# Patient Record
Sex: Male | Born: 1980 | Race: White | Hispanic: No | Marital: Single | Smoking: Never smoker
Health system: Southern US, Community
[De-identification: ages and names within clinical notes are randomized; demographics above are authoritative.]

## PROBLEM LIST (undated history)

## (undated) DIAGNOSIS — R569 Unspecified convulsions: Secondary | ICD-10-CM

## (undated) HISTORY — PX: NASAL SINUS SURGERY: SHX719

---

## 2014-06-28 ENCOUNTER — Ambulatory Visit (INDEPENDENT_AMBULATORY_CARE_PROVIDER_SITE_OTHER): Payer: BC Managed Care – PPO | Admitting: Sports Medicine

## 2014-06-28 DIAGNOSIS — M7650 Patellar tendinitis, unspecified knee: Secondary | ICD-10-CM

## 2014-06-28 DIAGNOSIS — M7651 Patellar tendinitis, right knee: Secondary | ICD-10-CM | POA: Insufficient documentation

## 2014-06-28 MED ORDER — NITROGLYCERIN 0.2 MG/HR TD PT24: MEDICATED_PATCH | TRANSDERMAL | Status: DC

## 2014-06-28 MED ORDER — DICLOFENAC SODIUM 75 MG PO TBEC: 75.0000 mg | DELAYED_RELEASE_TABLET | Freq: Two times a day (BID) | ORAL | Status: DC

## 2014-06-28 NOTE — Progress Notes (Signed)
  Subjective:    CC: Right knee pain  HPI:  This is a very pleasant 33 year old male, for the past 5 months he said and he localizes at the inferior pole of the right patella over the patellar tendon. Is moderate, persistent, worse with deep knee bending, squats, and lunches. He has not tried any NSAIDs, he did some physical therapy where they worked only on stretching but not strengthening.  Past medical history, Surgical history, Family history not pertinant except as noted below, Social history, Allergies, and medications have been entered into the medical record, reviewed, and no changes needed.   Review of Systems: No headache, visual changes, nausea, vomiting, diarrhea, constipation, dizziness, abdominal pain, skin rash, fevers, chills, night sweats, swollen lymph nodes, weight loss, chest pain, body aches, joint swelling, muscle aches, shortness of breath, mood changes, visual or auditory hallucinations.  Objective:    General: Well Developed, well nourished, and in no acute distress.  Neuro: Alert and oriented x3, extra-ocular muscles intact, sensation grossly intact.  HEENT: Normocephalic, atraumatic, pupils equal round reactive to light, neck supple, no masses, no lymphadenopathy, thyroid nonpalpable.  Skin: Warm and dry, no rashes noted.  Cardiac: Regular rate and rhythm, no murmurs rubs or gallops.  Respiratory: Clear to auscultation bilaterally. Not using accessory muscles, speaking in full sentences.  Abdominal: Soft, nontender, nondistended, positive bowel sounds, no masses, no organomegaly.  Right Knee: Normal to inspection with no erythema or effusion or obvious bony abnormalities. Palpation normal with no warmth or joint line tenderness or patellar tenderness or condyle tenderness. No tenderness of the patellar facets. ROM normal in flexion and extension and lower leg rotation. Ligaments with solid consistent endpoints including ACL, PCL, LCL, MCL. Negative Mcmurray's and  provocative meniscal tests. Non painful patellar compression. Tender to palpation at the proximal patellar tendon at its insertion on the patella. Hamstring and quadriceps strength is normal.  Cho-Pat strap placed  Impression and Recommendations:    The patient was counselled, risk factors were discussed, anticipatory guidance given.

## 2014-06-28 NOTE — Assessment & Plan Note (Signed)
Nitroglycerin patches, Cho-Pat strep, formal physical therapy, diclofenac. Return in 6 weeks.

## 2014-07-01 ENCOUNTER — Encounter: Payer: Self-pay | Admitting: Emergency Medicine

## 2014-07-01 ENCOUNTER — Emergency Department
Admission: EM | Admit: 2014-07-01 | Discharge: 2014-07-01 | Disposition: A | Discharge status: Home / Self Care | Payer: BC Managed Care – PPO | Source: Home / Self Care | Attending: Emergency Medicine | Admitting: Emergency Medicine

## 2014-07-01 DIAGNOSIS — L237 Allergic contact dermatitis due to plants, except food: Secondary | ICD-10-CM

## 2014-07-01 DIAGNOSIS — L255 Unspecified contact dermatitis due to plants, except food: Secondary | ICD-10-CM

## 2014-07-01 HISTORY — DX: Unspecified convulsions: R56.9

## 2014-07-01 MED ORDER — CLOBETASOL PROPIONATE 0.05 % EX CREA: 1.0000 "application " | TOPICAL_CREAM | Freq: Two times a day (BID) | CUTANEOUS | Status: DC

## 2014-07-01 MED ORDER — PREDNISONE 20 MG PO TABS: 20.0000 mg | ORAL_TABLET | Freq: Two times a day (BID) | ORAL | Status: DC

## 2014-07-01 NOTE — ED Notes (Signed)
Pt c/o rash on his RT arm and hand x 8 days. Denies pain. Hydrocortisone cream is not helping.

## 2014-07-01 NOTE — ED Provider Notes (Signed)
CSN: [id number]     Arrival date & time 07/01/14  1200 History   First MD Initiated Contact with Patient 07/01/14 1225     Chief Complaint  Patient presents with  . Rash   (Consider location/radiation/quality/duration/timing/severity/associated sxs/prior Treatment) HPI This patient complains of a RASH.  He was cutting down a tree in his back yard.  He knows that there is poison ivy back there but wasn't aware if he touched it.  Location: hand, arm, leg  Onset: last few days   Course: worsening Self-treated with: cortisone             Improvement with treatment: mild  History Itching: yes  Tenderness: no  New medications/antibiotics: no  Pet exposure: no  Recent travel or tropical exposure: no  New soaps, shampoos, detergent, clothing: no  Tick/insect exposure: no   Red Flags Feeling ill: no  Fever: no  Facial/tongue swelling/difficulty breathing:  no  Diabetic or immunocompromised: no      Past Medical History  Diagnosis Date  . Seizures    Past Surgical History  Procedure Laterality Date  . Nasal sinus surgery     History reviewed. No pertinent family history. History  Substance Use Topics  . Smoking status: Never Smoker   . Smokeless tobacco: Not on file  . Alcohol Use: No    Review of Systems  All other systems reviewed and are negative.   Allergies  Bee venom  Home Medications   Prior to Admission medications   Medication Sig Start Date End Date Taking? Authorizing Provider  clobetasol cream (TEMOVATE) 0.05 % Apply 1 application topically 2 (two) times daily. Apply to affected areas 2-3 times per day 07/01/14   Marlaine Hind, MD  diclofenac (VOLTAREN) 75 MG EC tablet Take 1 tablet (75 mg total) by mouth 2 (two) times daily. 06/28/14   Monica Becton, MD  nitroGLYCERIN (NITRODUR - DOSED IN MG/24 HR) 0.2 mg/hr patch Cut and apply 1/4 patch to most painful area q24h. 06/28/14   Monica Becton, MD  predniSONE (DELTASONE) 20 MG tablet Take  1 tablet (20 mg total) by mouth 2 (two) times daily with a meal. 07/01/14   Marlaine Hind, MD   BP 120/76  Pulse 69  Temp(Src) 98.4 F (36.9 C) (Oral)  Resp 16  Ht  (1.88 m)  Wt 185 lb (83.915 kg)  BMI 23.74 kg/m2  SpO2 99% Physical Exam  Nursing note and vitals reviewed. Constitutional: He is oriented to person, place, and time. He appears well-developed and well-nourished.  HENT:  Head: Normocephalic and atraumatic.  Eyes: No scleral icterus.  Neck: Neck supple.  Cardiovascular: Regular rhythm and normal heart sounds.   Pulmonary/Chest: Effort normal and breath sounds normal. No respiratory distress.  Neurological: He is alert and oriented to person, place, and time.  Skin: Skin is warm and dry. Rash noted.     Rash as drawn.  Scattered linear and erythematous excorations c/w contact dermatitis.  Psychiatric: He has a normal mood and affect. His speech is normal.    ED Course  Procedures (including critical care time) Labs Review Labs Reviewed - No data to display  Imaging Review No results found.   MDM   1. Poison ivy dermatitis    Patient with poison ivy dermatitis.  Will Rx Clobetasol cream.  If not improved, start the prednisone Rx.  Avoid scratching and further irritation.  Follow up as needed.    Marlaine Hind, MD 07/01/14 1230

## 2014-07-15 ENCOUNTER — Ambulatory Visit (INDEPENDENT_AMBULATORY_CARE_PROVIDER_SITE_OTHER): Payer: BC Managed Care – PPO | Admitting: Physical Therapy

## 2014-07-15 DIAGNOSIS — R5381 Other malaise: Secondary | ICD-10-CM

## 2014-07-15 DIAGNOSIS — M2560 Stiffness of unspecified joint, not elsewhere classified: Secondary | ICD-10-CM

## 2014-07-15 DIAGNOSIS — M25569 Pain in unspecified knee: Secondary | ICD-10-CM

## 2014-07-15 DIAGNOSIS — M7650 Patellar tendinitis, unspecified knee: Secondary | ICD-10-CM

## 2014-07-18 ENCOUNTER — Encounter: Payer: BC Managed Care – PPO | Admitting: Physical Therapy

## 2014-07-21 ENCOUNTER — Encounter (INDEPENDENT_AMBULATORY_CARE_PROVIDER_SITE_OTHER): Payer: BC Managed Care – PPO | Admitting: Physical Therapy

## 2014-07-21 DIAGNOSIS — M7650 Patellar tendinitis, unspecified knee: Secondary | ICD-10-CM

## 2014-07-21 DIAGNOSIS — M25569 Pain in unspecified knee: Secondary | ICD-10-CM

## 2014-07-21 DIAGNOSIS — M2560 Stiffness of unspecified joint, not elsewhere classified: Secondary | ICD-10-CM

## 2014-07-21 DIAGNOSIS — R5381 Other malaise: Secondary | ICD-10-CM

## 2014-07-25 ENCOUNTER — Encounter: Payer: BC Managed Care – PPO | Admitting: Physical Therapy

## 2014-07-28 ENCOUNTER — Encounter (INDEPENDENT_AMBULATORY_CARE_PROVIDER_SITE_OTHER): Payer: BC Managed Care – PPO

## 2014-07-28 DIAGNOSIS — R5381 Other malaise: Secondary | ICD-10-CM

## 2014-07-28 DIAGNOSIS — M2560 Stiffness of unspecified joint, not elsewhere classified: Secondary | ICD-10-CM

## 2014-07-28 DIAGNOSIS — M25569 Pain in unspecified knee: Secondary | ICD-10-CM

## 2014-07-28 DIAGNOSIS — M7650 Patellar tendinitis, unspecified knee: Secondary | ICD-10-CM

## 2014-08-01 ENCOUNTER — Encounter (INDEPENDENT_AMBULATORY_CARE_PROVIDER_SITE_OTHER): Payer: BC Managed Care – PPO | Admitting: Physical Therapy

## 2014-08-01 DIAGNOSIS — M2560 Stiffness of unspecified joint, not elsewhere classified: Secondary | ICD-10-CM

## 2014-08-01 DIAGNOSIS — M25569 Pain in unspecified knee: Secondary | ICD-10-CM

## 2014-08-01 DIAGNOSIS — R5381 Other malaise: Secondary | ICD-10-CM

## 2014-08-09 ENCOUNTER — Ambulatory Visit: Payer: BC Managed Care – PPO | Admitting: Sports Medicine

## 2014-08-09 ENCOUNTER — Encounter: Payer: BC Managed Care – PPO | Admitting: Physical Therapy

## 2014-08-11 ENCOUNTER — Encounter (INDEPENDENT_AMBULATORY_CARE_PROVIDER_SITE_OTHER): Payer: Self-pay

## 2014-08-11 ENCOUNTER — Encounter (INDEPENDENT_AMBULATORY_CARE_PROVIDER_SITE_OTHER): Payer: BC Managed Care – PPO | Admitting: Physical Therapy

## 2014-08-11 DIAGNOSIS — M25569 Pain in unspecified knee: Secondary | ICD-10-CM

## 2014-08-11 DIAGNOSIS — M7650 Patellar tendinitis, unspecified knee: Secondary | ICD-10-CM

## 2014-08-11 DIAGNOSIS — M2560 Stiffness of unspecified joint, not elsewhere classified: Secondary | ICD-10-CM

## 2014-08-11 DIAGNOSIS — R5381 Other malaise: Secondary | ICD-10-CM

## 2014-08-12 ENCOUNTER — Ambulatory Visit (INDEPENDENT_AMBULATORY_CARE_PROVIDER_SITE_OTHER): Payer: BC Managed Care – PPO | Admitting: Sports Medicine

## 2014-08-12 ENCOUNTER — Telehealth: Payer: Self-pay | Admitting: *Deleted

## 2014-08-12 ENCOUNTER — Encounter: Payer: Self-pay | Admitting: Sports Medicine

## 2014-08-12 VITALS — BP 127/71 | HR 72 | Ht 74.0 in | Wt 184.0 lb

## 2014-08-12 DIAGNOSIS — M7651 Patellar tendinitis, right knee: Secondary | ICD-10-CM

## 2014-08-12 NOTE — Progress Notes (Signed)
  Subjective:    CC: Followup  HPI: Right patellar tendinitis: Fred Elliott is a pleasant and healthy 33 year old male, for approximately 6 months now he's had pain that he localizes at the inferior pole of the patella, worse with bending the knee and squatting. Symptoms have been diagnosed as patellar tendinitis and we have now gone through 6 weeks of formal physical therapy, patellar strapping, topical nitroglycerin, and NSAIDs. Unfortunately symptoms are persistent, localized, without radiation, moderate.  Past medical history, Surgical history, Family history not pertinant except as noted below, Social history, Allergies, and medications have been entered into the medical record, reviewed, and no changes needed.   Review of Systems: No fevers, chills, night sweats, weight loss, chest pain, or shortness of breath.   Objective:    General: Well Developed, well nourished, and in no acute distress.  Neuro: Alert and oriented x3, extra-ocular muscles intact, sensation grossly intact.  HEENT: Normocephalic, atraumatic, pupils equal round reactive to light, neck supple, no masses, no lymphadenopathy, thyroid nonpalpable.  Skin: Warm and dry, no rashes. Cardiac: Regular rate and rhythm, no murmurs rubs or gallops, no lower extremity edema.  Respiratory: Clear to auscultation bilaterally. Not using accessory muscles, speaking in full sentences. Right Knee: Normal to inspection with no erythema or effusion or obvious bony abnormalities. Palpation normal with no warmth or joint line tenderness or patellar tenderness or condyle tenderness. ROM normal in flexion and extension and lower leg rotation. Ligaments with solid consistent endpoints including ACL, PCL, LCL, MCL. Negative Mcmurray's and provocative meniscal tests. Non painful patellar compression. Tender to palpation at the inferior: Patella over the patellar tendon itself. There is some visible irritation from the nitroglycerin patches. Hamstring  and quadriceps strength is normal.  Impression and Recommendations:

## 2014-08-12 NOTE — Telephone Encounter (Signed)
Prior auth for MRI knee approved [id number] expires 09/10/14. Corliss SkainsJamie Rakwon Letourneau, CMA

## 2014-08-12 NOTE — Assessment & Plan Note (Signed)
Persistent symptoms of right-sided patellar tendinitis despite 6 weeks of physical therapy, topical nitroglycerin, patellar strap, NSAIDs. Symptoms have now been present for a total of 6 months. At this point he does become a candidate for platelet rich plasma injection. I would like to obtain an MRI first confirm the diagnosis. Next line return to see me to go over MRI results.

## 2014-08-15 ENCOUNTER — Ambulatory Visit (INDEPENDENT_AMBULATORY_CARE_PROVIDER_SITE_OTHER): Payer: BC Managed Care – PPO

## 2014-08-15 DIAGNOSIS — M7651 Patellar tendinitis, right knee: Secondary | ICD-10-CM

## 2014-08-17 ENCOUNTER — Encounter: Payer: BC Managed Care – PPO | Admitting: Physical Therapy

## 2014-08-18 ENCOUNTER — Ambulatory Visit: Payer: BC Managed Care – PPO | Admitting: Sports Medicine

## 2014-08-22 ENCOUNTER — Ambulatory Visit (INDEPENDENT_AMBULATORY_CARE_PROVIDER_SITE_OTHER): Payer: BC Managed Care – PPO | Admitting: Sports Medicine

## 2014-08-22 ENCOUNTER — Encounter: Payer: Self-pay | Admitting: Sports Medicine

## 2014-08-22 VITALS — BP 119/73 | HR 74 | Ht 74.0 in | Wt 185.0 lb

## 2014-08-22 DIAGNOSIS — M7651 Patellar tendinitis, right knee: Secondary | ICD-10-CM

## 2014-08-22 MED ORDER — HYDROCODONE-ACETAMINOPHEN 5-325 MG PO TABS: 1.0000 | ORAL_TABLET | Freq: Three times a day (TID) | ORAL | Status: DC | PRN

## 2014-08-22 NOTE — Progress Notes (Signed)
   Procedure: Real-time Ultrasound Guided Platelet Rich Plasma (PRP) Injection of right proximalpatellar tendon Device: GE Logiq E  Verbal informed consent obtained.  Time-out conducted.  Noted no overlying erythema, induration, or other signs of local infection.  Obtained 30 cc of blood from peripheral vein, using the "PEAK" centrifuge, red blood cells were separated from the plasma. Subsequently red blood cells were drained leaving only plasma with the buffy coat layer between the desired lines. Platelet poor plasma was then centrifuged out, and remaining platelet rich plasma aspirated into a 5 cc syringe.  Skin prepped in a sterile fashion.  Local anesthesia: Topical Ethyl chloride.  With sterile technique and under real time ultrasound guidance the platelet rich plasma (PRP) obtained above:was injected into the diseased deep proximal patellar tendon tissue.   Completed without difficulty  Advised to call if fevers/chills, erythema, induration, drainage, or persistent bleeding.  Images permanently stored and available for review in the ultrasound unit.  Impression: Technically successful ultrasound guided Platelet Rich Plasma (PRP) injection.  I spent 40 minutes with this patient, greater than 50% was face-to-face time counseling and procedural time regarding the below diagnosis.  Impression and Recommendations:

## 2014-08-22 NOTE — Assessment & Plan Note (Signed)
PRP injection as above. Hydrocodone for pain.  Return in one month. No activity for the next week, gentle range of motion for a week afterwards, and then return to rehabilitation exercises.

## 2014-08-24 ENCOUNTER — Encounter: Payer: BC Managed Care – PPO | Admitting: Physical Therapy

## 2014-08-31 ENCOUNTER — Encounter: Payer: BC Managed Care – PPO | Admitting: Physical Therapy

## 2014-09-07 ENCOUNTER — Encounter: Payer: Self-pay | Admitting: Sports Medicine

## 2014-09-07 ENCOUNTER — Encounter: Payer: BC Managed Care – PPO | Admitting: Physical Therapy

## 2014-09-14 ENCOUNTER — Encounter: Payer: BC Managed Care – PPO | Admitting: Physical Therapy

## 2014-09-19 ENCOUNTER — Encounter: Payer: Self-pay | Admitting: Sports Medicine

## 2014-09-19 ENCOUNTER — Ambulatory Visit (INDEPENDENT_AMBULATORY_CARE_PROVIDER_SITE_OTHER): Payer: BC Managed Care – PPO | Admitting: Sports Medicine

## 2014-09-19 VITALS — BP 130/79 | HR 75 | Ht 74.0 in | Wt 188.0 lb

## 2014-09-19 DIAGNOSIS — M7651 Patellar tendinitis, right knee: Secondary | ICD-10-CM | POA: Diagnosis not present

## 2014-09-19 DIAGNOSIS — M5416 Radiculopathy, lumbar region: Secondary | ICD-10-CM | POA: Diagnosis not present

## 2014-09-19 MED ORDER — PREDNISONE 50 MG PO TABS: ORAL_TABLET | ORAL | Status: DC

## 2014-09-19 NOTE — Assessment & Plan Note (Signed)
Pain is resolved after platelet rich plasma injection one month ago. Continue rehabilitation exercises and straightening of the quads and hamstrings. May avoid stretching for now.

## 2014-09-19 NOTE — Assessment & Plan Note (Signed)
Clinically represents a left-sided L4 radiculopathy. Prednisone, home rehabilitation exercises.  Return in one month for this, if persistent L4 radiculopathy we will proceed with an MRI for interventional planning.

## 2014-09-19 NOTE — Progress Notes (Signed)
  Subjective:    CC: Follow-up  HPI: Right patellar tendinitis: At the last visit we did a platelet rich plasma injection after 3 months of failed conservative therapy. He returns today pain-free but somewhat weak. Happy with results so far.  Left leg numbness: This occurred after an intense stretching session, felt a pop in his back and subsequent numbness on the anterolateral aspect of his left thigh without radiation past the knee. Moderate, persistent, does have some discogenic-type pain in the back worse with flexion, no constitutional symptoms or bowel or bladder dysfunction.  Past medical history, Surgical history, Family history not pertinant except as noted below, Social history, Allergies, and medications have been entered into the medical record, reviewed, and no changes needed.   Review of Systems: No fevers, chills, night sweats, weight loss, chest pain, or shortness of breath.   Objective:    General: Well Developed, well nourished, and in no acute distress.  Neuro: Alert and oriented x3, extra-ocular muscles intact, sensation grossly intact.  HEENT: Normocephalic, atraumatic, pupils equal round reactive to light, neck supple, no masses, no lymphadenopathy, thyroid nonpalpable.  Skin: Warm and dry, no rashes. Cardiac: Regular rate and rhythm, no murmurs rubs or gallops, no lower extremity edema.  Respiratory: Clear to auscultation bilaterally. Not using accessory muscles, speaking in full sentences. Right Knee: Normal to inspection with no erythema or effusion or obvious bony abnormalities. Palpation normal with no warmth or joint line tenderness or patellar tenderness or condyle tenderness. ROM normal in flexion and extension and lower leg rotation. Ligaments with solid consistent endpoints including ACL, PCL, LCL, MCL. Negative Mcmurray's and provocative meniscal tests. Non painful patellar compression. Patellar and quadriceps tendons unremarkable. Hamstring and  quadriceps strength is normal. Back Exam:  Inspection: Unremarkable  Motion: Flexion 45 deg, Extension 45 deg, Side Bending to 45 deg bilaterally,  Rotation to 45 deg bilaterally  SLR laying: Negative  XSLR laying: Negative  Palpable tenderness: None. FABER: negative. Sensory change: Gross sensation intact to all lumbar and sacral dermatomes.  Reflexes: 2+ at both patellar tendons, 2+ at achilles tendons, Babinski's downgoing.  Strength at foot  Plantar-flexion: 5/5 Dorsi-flexion: 5/5 Eversion: 5/5 Inversion: 5/5  Leg strength  Quad: 5/5 Hamstring: 5/5 Hip flexor: 5/5 Hip abductors: 5/5  Gait unremarkable.  Impression and Recommendations:

## 2014-09-27 ENCOUNTER — Encounter: Payer: Self-pay | Admitting: Sports Medicine

## 2014-10-04 ENCOUNTER — Encounter: Payer: Self-pay | Admitting: Sports Medicine

## 2014-10-10 ENCOUNTER — Ambulatory Visit: Payer: BC Managed Care – PPO | Admitting: Sports Medicine

## 2014-10-11 ENCOUNTER — Ambulatory Visit (INDEPENDENT_AMBULATORY_CARE_PROVIDER_SITE_OTHER): Payer: BC Managed Care – PPO | Admitting: Sports Medicine

## 2014-10-11 ENCOUNTER — Encounter: Payer: Self-pay | Admitting: Sports Medicine

## 2014-10-11 DIAGNOSIS — M7651 Patellar tendinitis, right knee: Secondary | ICD-10-CM | POA: Diagnosis not present

## 2014-10-11 DIAGNOSIS — M5416 Radiculopathy, lumbar region: Secondary | ICD-10-CM

## 2014-10-11 MED ORDER — HYDROCODONE-ACETAMINOPHEN 5-325 MG PO TABS: 1.0000 | ORAL_TABLET | Freq: Three times a day (TID) | ORAL | Status: DC | PRN

## 2014-10-11 MED ORDER — AMBULATORY NON FORMULARY MEDICATION: Status: DC

## 2014-10-11 NOTE — Assessment & Plan Note (Signed)
Improved significantly with initial treatment however continues to have some left-sided L4 radicular symptoms. He does have a sit down desk at home, we are going to give him a prescription for a standup desk. Return in a month.

## 2014-10-11 NOTE — Assessment & Plan Note (Signed)
Continues to do well after platelet rich plasma injection, usually pain-free, at terminal knee extension he does get 1/10 pain. He does have his ski trip coming up, hinged knee brace given, hydrocodone for use before skiing. Continue rehabilitation exercises. Return in a month for this.

## 2014-10-11 NOTE — Progress Notes (Signed)
  Subjective:    CC: Follow-up  HPI: Right patellar tendinitis: Excellent response to platelet rich plasma injection, pain is usually not existent, he does get 1/10 pain at terminal extension of his right knee. Overall doing well. He does have a trip coming up where he will be skiing. Wonders what he needs to do to be able to tolerate this trip.  Left lumbar radiculopathy: Left L4, he does spend all day sitting down at his desk, he would like a prescription for a standup desk for proceeding any further with advanced imaging. Symptoms are mild, persistent, much better after rehabilitation exercises and prednisone.  Past medical history, Surgical history, Family history not pertinant except as noted below, Social history, Allergies, and medications have been entered into the medical record, reviewed, and no changes needed.   Review of Systems: No fevers, chills, night sweats, weight loss, chest pain, or shortness of breath.   Objective:    General: Well Developed, well nourished, and in no acute distress.  Neuro: Alert and oriented x3, extra-ocular muscles intact, sensation grossly intact.  HEENT: Normocephalic, atraumatic, pupils equal round reactive to light, neck supple, no masses, no lymphadenopathy, thyroid nonpalpable.  Skin: Warm and dry, no rashes. Cardiac: Regular rate and rhythm, no murmurs rubs or gallops, no lower extremity edema.  Respiratory: Clear to auscultation bilaterally. Not using accessory muscles, speaking in full sentences. Right Knee: Normal to inspection with no erythema or effusion or obvious bony abnormalities. Palpation normal with no warmth or joint line tenderness or patellar tenderness or condyle tenderness. ROM normal in flexion and extension and lower leg rotation. Ligaments with solid consistent endpoints including ACL, PCL, LCL, MCL. Negative Mcmurray's and provocative meniscal tests. Non painful patellar compression. Patellar and quadriceps tendons  unremarkable. Hamstring and quadriceps strength is normal.  Impression and Recommendations:

## 2016-06-05 ENCOUNTER — Ambulatory Visit: Payer: Self-pay | Admitting: Sports Medicine

## 2016-06-06 ENCOUNTER — Ambulatory Visit (INDEPENDENT_AMBULATORY_CARE_PROVIDER_SITE_OTHER): Payer: BLUE CROSS/BLUE SHIELD | Admitting: Sports Medicine

## 2016-06-06 ENCOUNTER — Encounter: Payer: Self-pay | Admitting: Sports Medicine

## 2016-06-06 ENCOUNTER — Ambulatory Visit (INDEPENDENT_AMBULATORY_CARE_PROVIDER_SITE_OTHER): Payer: BLUE CROSS/BLUE SHIELD

## 2016-06-06 DIAGNOSIS — M549 Dorsalgia, unspecified: Secondary | ICD-10-CM

## 2016-06-06 DIAGNOSIS — M5489 Other dorsalgia: Secondary | ICD-10-CM | POA: Diagnosis not present

## 2016-06-06 MED ORDER — CYCLOBENZAPRINE HCL 10 MG PO TABS: ORAL_TABLET | ORAL | 0 refills | Status: DC

## 2016-06-06 MED ORDER — MELOXICAM 15 MG PO TABS: ORAL_TABLET | ORAL | 3 refills | Status: DC

## 2016-06-06 MED ORDER — PREDNISONE 50 MG PO TABS: ORAL_TABLET | ORAL | 0 refills | Status: DC

## 2016-06-06 NOTE — Assessment & Plan Note (Signed)
Seems to be thoracic/upper lumbar, and higher up than before. Thoracic and lumbar x-rays, prednisone, meloxicam, Flexeril at bedtime. Pain is predominantly nocturnal and in the morning. Rehabilitation exercises.  Return to see me in one month.

## 2016-06-06 NOTE — Progress Notes (Signed)
  Subjective:    CC: Low back pain  HPI: This is a pleasant 35 year old male, we treated him about 2 years ago for left lumbar radiculopathy, he did well with conservative measures, no imaging was obtained. Unfortunately he's developed new pain that he localizes somewhat higher in the bottom of his thoracic spine without radiation, worse in the morning and worse with twisting and extension but not with flexion, Valsalva, driving a car. No trauma, no changes in activity, no constitutional symptoms. No bowel or bladder dysfunction or saddle numbness.  Past medical history, Surgical history, Family history not pertinant except as noted below, Social history, Allergies, and medications have been entered into the medical record, reviewed, and no changes needed.   Review of Systems: No fevers, chills, night sweats, weight loss, chest pain, or shortness of breath.   Objective:    General: Well Developed, well nourished, and in no acute distress.  Neuro: Alert and oriented x3, extra-ocular muscles intact, sensation grossly intact.  HEENT: Normocephalic, atraumatic, pupils equal round reactive to light, neck supple, no masses, no lymphadenopathy, thyroid nonpalpable.  Skin: Warm and dry, no rashes. Cardiac: Regular rate and rhythm, no murmurs rubs or gallops, no lower extremity edema.  Respiratory: Clear to auscultation bilaterally. Not using accessory muscles, speaking in full sentences. Back Exam:  Inspection: Unremarkable  Motion: Flexion 45 deg, Extension 45 deg, Side Bending to 45 deg bilaterally,  Rotation to 45 deg bilaterally  SLR laying: Negative  XSLR laying: Negative  Palpable tenderness: None. FABER: negative. Sensory change: Gross sensation intact to all lumbar and sacral dermatomes.  Reflexes: 2+ at both patellar tendons, 2+ at achilles tendons, Babinski's downgoing.  Strength at foot  Plantar-flexion: 5/5 Dorsi-flexion: 5/5 Eversion: 5/5 Inversion: 5/5  Leg strength  Quad: 5/5  Hamstring: 5/5 Hip flexor: 5/5 Hip abductors: 5/5  Gait unremarkable.  Impression and Recommendations:    Mid back pain Seems to be thoracic/upper lumbar, and higher up than before. Thoracic and lumbar x-rays, prednisone, meloxicam, Flexeril at bedtime. Pain is predominantly nocturnal and in the morning. Rehabilitation exercises.  Return to see me in one month.  I spent 25 minutes with this patient, greater than 50% was face-to-face time counseling regarding the above diagnoses

## 2016-07-04 ENCOUNTER — Ambulatory Visit: Payer: BLUE CROSS/BLUE SHIELD | Admitting: Sports Medicine

## 2016-07-20 IMAGING — MR MR KNEE*R* W/O CM
6 series · 38 of 40 positions shown · non-contrast
Comparison: None.

CLINICAL DATA: Infrapatellar pain with occasional swelling for 6
months. No acute injury or prior relevant surgery. Initial
encounter.

EXAM:
MRI OF THE RIGHT KNEE WITHOUT CONTRAST
TECHNIQUE: Multiplanar, multisequence MR imaging of the knee was performed. No
intravenous contrast was administered.

[Series 5: PD fat-sat · axial · 3.0mm · 0.31mm/px · z∈[-42,+60]mm · 9 of 32 slices shown (1 of 4)]
[im 1/32]
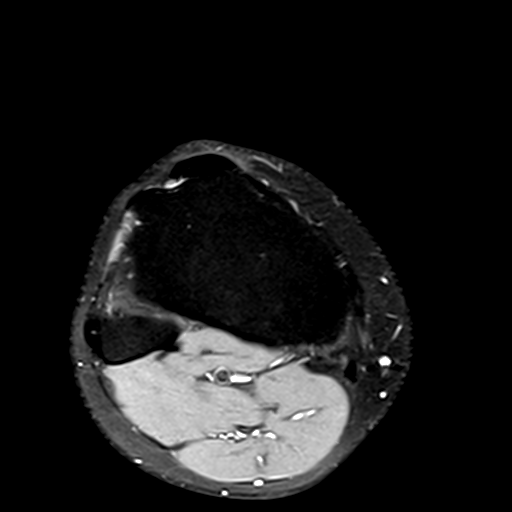
[im 4/32]
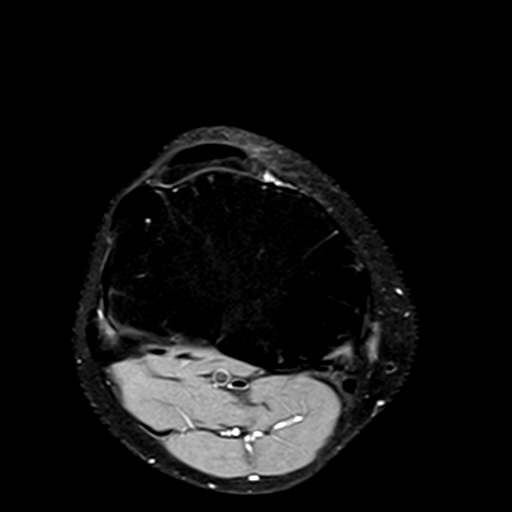
[im 8/32]
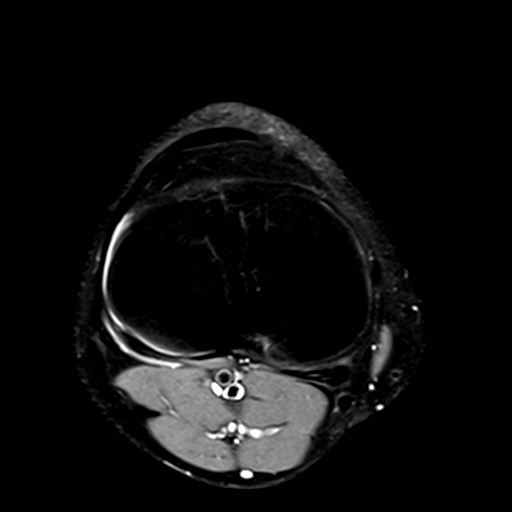
[im 12/32]
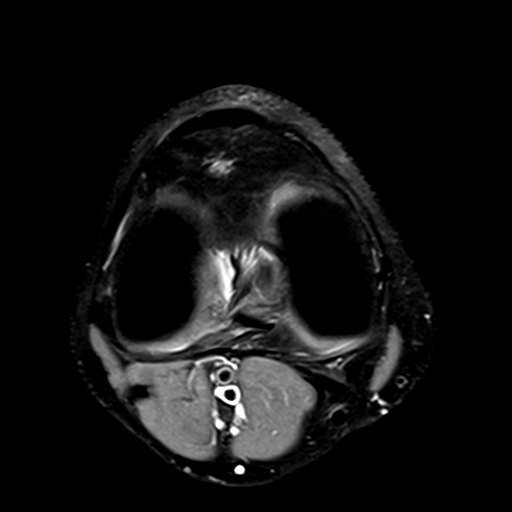
[im 16/32]
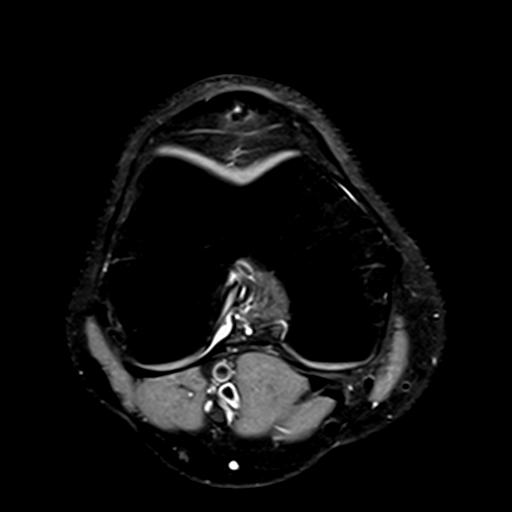
[im 20/32]
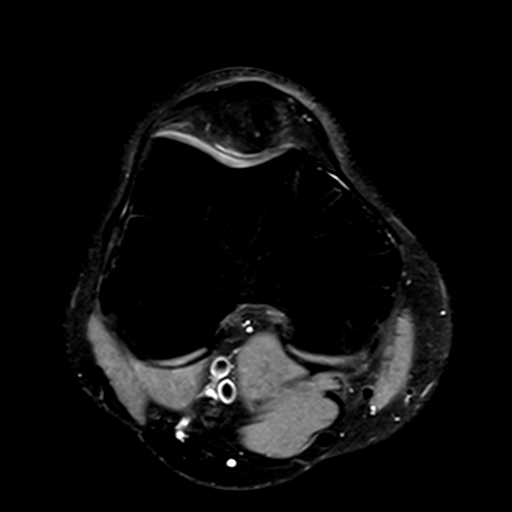
[im 24/32]
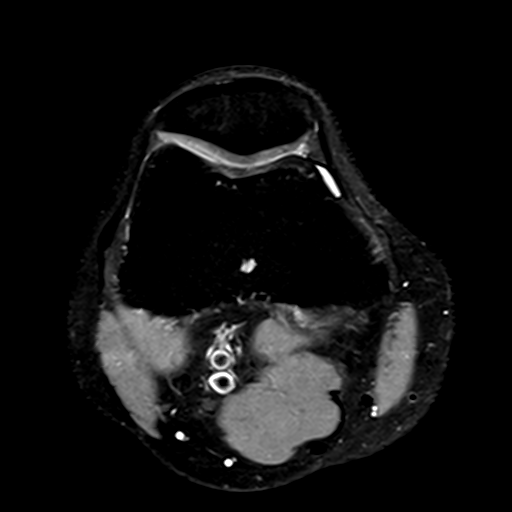
[im 28/32]
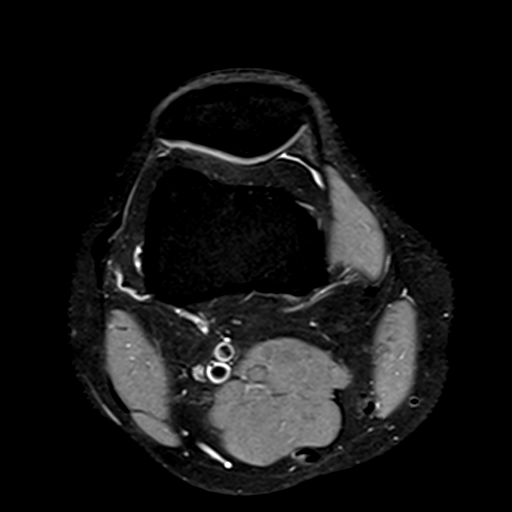
[im 32/32]
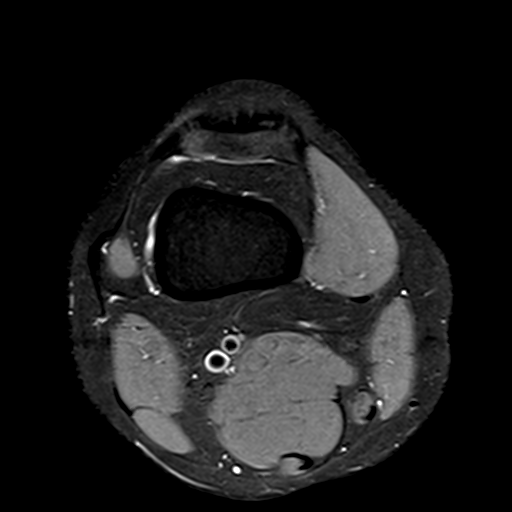

[Series 6: T1 · coronal · 3.0mm · 0.50mm/px · 5 of 29 slices shown]
[im 1/29]
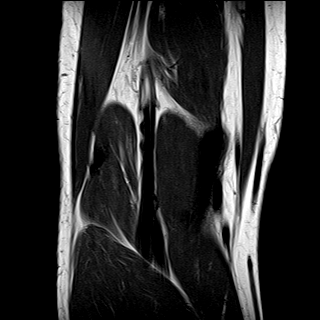
[im 5/29]
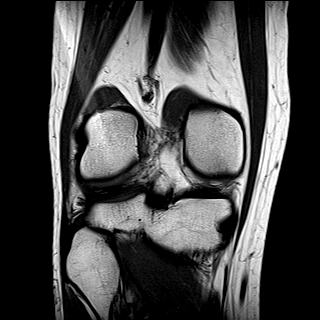
[im 10/29]
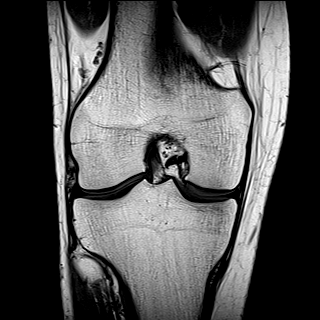
[im 15/29]
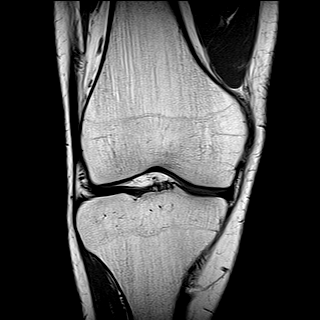
[im 19/29]
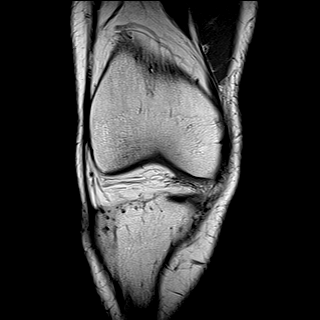

[Series 7: T2 fat-sat · coronal · 3.0mm · 0.50mm/px · 7 of 29 slices shown]
[im 1/29]
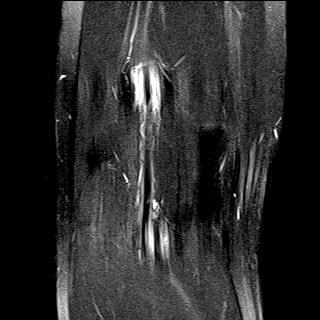
[im 5/29]
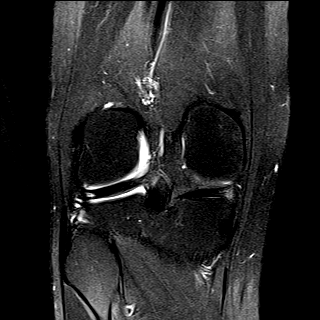
[im 10/29]
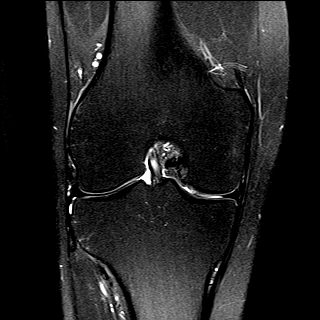
[im 15/29]
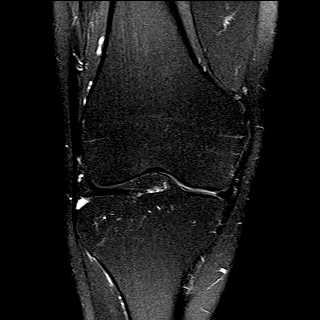
[im 19/29]
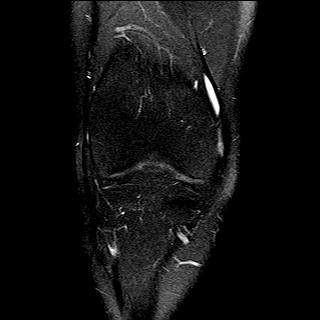
[im 24/29]
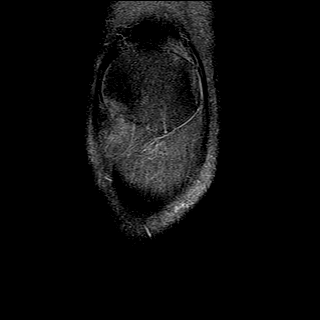
[im 29/29]
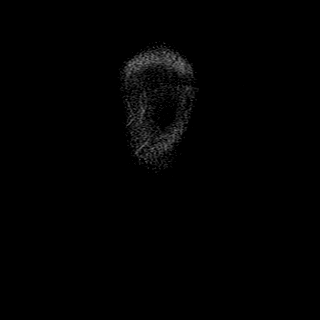

[Series 8: PD fat-sat · coronal · 3.0mm · 0.62mm/px · 7 of 29 slices shown (2 of 4)]
[im 1/29]
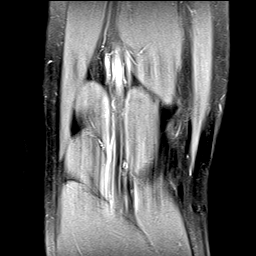
[im 5/29]
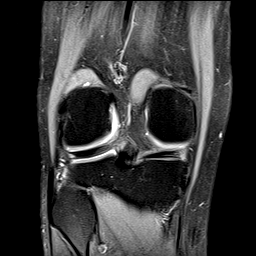
[im 10/29]
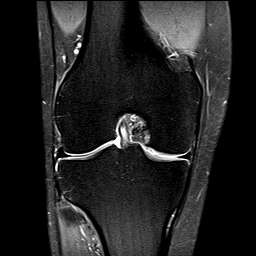
[im 15/29]
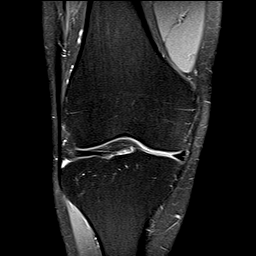
[im 19/29]
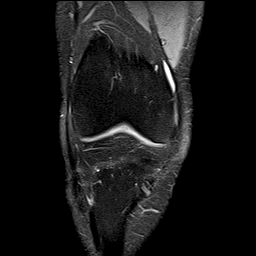
[im 24/29]
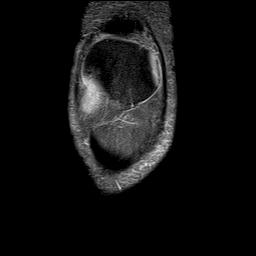
[im 29/29]
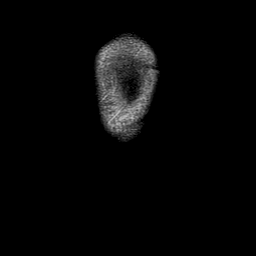

[Series 9: PD fat-sat · sagittal · 3.0mm · 0.62mm/px · 7 of 28 slices shown (3 of 4)]
[im 1/28]
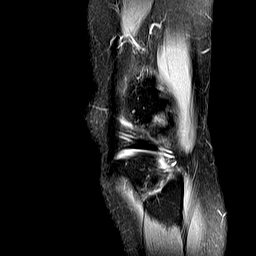
[im 5/28]
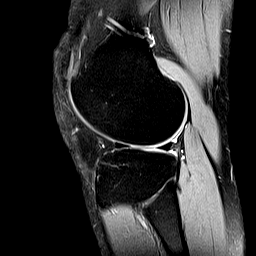
[im 10/28]
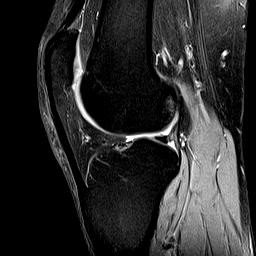
[im 14/28]
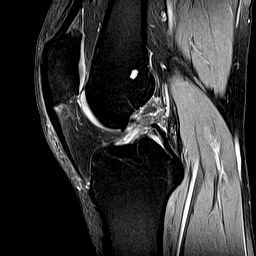
[im 19/28]
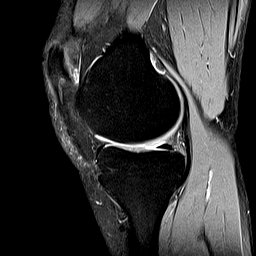
[im 23/28]
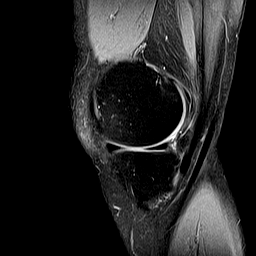
[im 28/28]
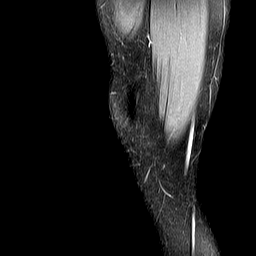

[Series 10: PD fat-sat · oblique · 2.0mm · 0.62mm/px · 3 of 11 slices shown (4 of 4)]
[im 1/11]
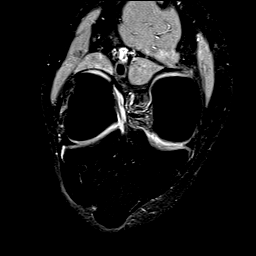
[im 6/11]
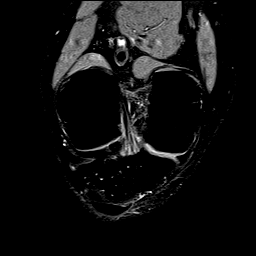
[im 11/11]
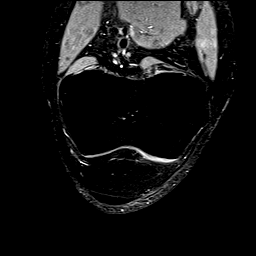

[38 of 40 positions shown; findings below may reference images not displayed]

FINDINGS: MENISCI

Medial meniscus:  Intact with normal morphology.

Lateral meniscus:  Intact with normal morphology.

LIGAMENTS

Cruciates:  Intact.

Collaterals:  Intact.

CARTILAGE

Patellofemoral:  Preserved.

Medial:  Preserved.

Lateral:  Preserved.

Joint:  No significant joint effusion.

Popliteal Fossa:  Unremarkable. No Baker's cyst.

Extensor Mechanism: The quadriceps tendon appears normal. There is
proximal patellar tendinosis/tendinitis. There is mild edema within
the prepatellar subcutaneous and the infrapatellar fat.

Bones: No significant extra-articular osseous findings. There is a
small well-circumscribed intraosseous lesion within the distal
femoral metaphysis, likely an incidental chondroid lesion.
IMPRESSION: 1. Proximal patellar tendinitis (jumper's knee).
2. No acute osteochondral findings.
3. The menisci, cruciate and collateral ligaments all appear normal.

## 2016-11-20 ENCOUNTER — Encounter: Payer: Self-pay | Admitting: Sports Medicine

## 2016-11-20 ENCOUNTER — Ambulatory Visit (INDEPENDENT_AMBULATORY_CARE_PROVIDER_SITE_OTHER): Payer: 59

## 2016-11-20 ENCOUNTER — Ambulatory Visit (INDEPENDENT_AMBULATORY_CARE_PROVIDER_SITE_OTHER): Payer: 59 | Admitting: Sports Medicine

## 2016-11-20 DIAGNOSIS — S298XXA Other specified injuries of thorax, initial encounter: Secondary | ICD-10-CM

## 2016-11-20 DIAGNOSIS — S2241XA Multiple fractures of ribs, right side, initial encounter for closed fracture: Secondary | ICD-10-CM | POA: Insufficient documentation

## 2016-11-20 MED ORDER — HYDROCODONE-ACETAMINOPHEN 5-325 MG PO TABS: 1.0000 | ORAL_TABLET | Freq: Three times a day (TID) | ORAL | 0 refills | Status: DC | PRN

## 2016-11-20 NOTE — Assessment & Plan Note (Addendum)
Question right-sided rib fracture.  Rib belt applied, short course of hydrocodone, x-rays. Return in 2 weeks. He also has a contusion over the left lower leg. Ambulatory, only minimal tenderness to palpation.  Right 8th and 9th rib fractures.  Keep belt on and take pain meds.  No physical exertion beyond activities of daily living.

## 2016-11-20 NOTE — Progress Notes (Addendum)
  Subjective:    CC: Chest injury  HPI: On Saturday this pleasant 36 year old male was skiing, he took a misstep and his torso was flexed to the right, he had immediate pain over his left rib cage. Since then he really hasn't had any shortness of breath or cough, no hemoptysis, simply pain over the anterolateral rib cage.  He also hit a tree, and has some bruising over his left anterior shin.  He does need a PCP.  Past medical history:  Negative.  See flowsheet/record as well for more information.  Surgical history: Negative.  See flowsheet/record as well for more information.  Family history: Negative.  See flowsheet/record as well for more information.  Social history: Negative.  See flowsheet/record as well for more information.  Allergies, and medications have been entered into the medical record, reviewed, and no changes needed.   Review of Systems: No fevers, chills, night sweats, weight loss, chest pain, or shortness of breath.   Objective:    General: Well Developed, well nourished, and in no acute distress.  Neuro: Alert and oriented x3, extra-ocular muscles intact, sensation grossly intact.  HEENT: Normocephalic, atraumatic, pupils equal round reactive to light, neck supple, no masses, no lymphadenopathy, thyroid nonpalpable.  Skin: Warm and dry, no rashes. Cardiac: Regular rate and rhythm, no murmurs rubs or gallops, no lower extremity edema.  Respiratory: Clear to auscultation bilaterally. Not using accessory muscles, speaking in full sentences. Chest wall: Tender to palpation over the left lower anterior ribs. Left Ankle: Visibly swollen and bruised over the anterior shin Range of motion is full in all directions. Strength is 5/5 in all directions. Stable lateral and medial ligaments; squeeze test and kleiger test unremarkable; Talar dome nontender; No pain at base of 5th MT; No tenderness over cuboid; No tenderness over N spot or navicular prominence No tenderness on  posterior aspects of lateral and medial malleolus No sign of peroneal tendon subluxations; Negative tarsal tunnel tinel's Able to walk 4 steps.  Chest wall was strapped with a compressive dressing.  Impression and Recommendations:    Fracture of two ribs, right, closed, initial encounter Question right-sided rib fracture.  Rib belt applied, short course of hydrocodone, x-rays. Return in 2 weeks. He also has a contusion over the left lower leg. Ambulatory, only minimal tenderness to palpation.  Right 8th and 9th rib fractures.  Keep belt on and take pain meds.  No physical exertion beyond activities of daily living.

## 2016-12-04 ENCOUNTER — Ambulatory Visit: Payer: 59 | Admitting: Sports Medicine

## 2016-12-09 ENCOUNTER — Ambulatory Visit (INDEPENDENT_AMBULATORY_CARE_PROVIDER_SITE_OTHER): Payer: 59 | Admitting: Physician Assistant

## 2016-12-09 ENCOUNTER — Encounter: Payer: Self-pay | Admitting: Physician Assistant

## 2016-12-09 VITALS — BP 123/86 | HR 71 | Ht 74.0 in | Wt 196.0 lb

## 2016-12-09 DIAGNOSIS — G8929 Other chronic pain: Secondary | ICD-10-CM

## 2016-12-09 DIAGNOSIS — Z23 Encounter for immunization: Secondary | ICD-10-CM

## 2016-12-09 DIAGNOSIS — Z0000 Encounter for general adult medical examination without abnormal findings: Secondary | ICD-10-CM

## 2016-12-09 DIAGNOSIS — M546 Pain in thoracic spine: Secondary | ICD-10-CM

## 2016-12-09 MED ORDER — MELOXICAM 15 MG PO TABS: 15.0000 mg | ORAL_TABLET | Freq: Every day | ORAL | 0 refills | Status: DC

## 2016-12-09 MED ORDER — CYCLOBENZAPRINE HCL 5 MG PO TABS: 5.0000 mg | ORAL_TABLET | Freq: Every evening | ORAL | 0 refills | Status: DC | PRN

## 2016-12-09 NOTE — Progress Notes (Signed)
HPI:                                                                Fred Elliott is a 36 y.o. male who presents to [facility]: [facility] Care [facility] Medicine today to establish care  Current Concerns include back pain  Hx of Epilepsy: He is not taking medications. Seizures began around age 24. Last seizure was around age 40. States he had an EEG in graduate school that was normal.  Back pain: this is chronic problem for him. Reports nocturnal aching midline back pain. It sometimes wakes him up at night. It usually improves throughout the day with activity. He has gotten a new mattress. He was seen by Dr. Karie Schwalbe for this in 05/2016 and prescribed Prednisone, Meloxicam, and Flexiril. He states he improved with treatment, but symptoms eventually returned. He has never done Pt/rehab. He is currently recovering from right-sided rib fractures from 4 weeks ago.  Health Maintenance Health Maintenance  Topic Date Due  . HIV Screening  12/24/1996  . TETANUS/TDAP  12/24/2000  . INFLUENZA VACCINE  06/22/2019 (Originally 05/21/2016)    Health Habits  Diet: good  Exercise: occasionally. Enjoys skiing  ETOH: rarely  Tobacco: never smoker  Drugs: no  Past Medical History:  Diagnosis Date  . Seizures (HCC)    4th grade, last seizure around age 71   Past Surgical History:  Procedure Laterality Date  . NASAL SINUS SURGERY     Social History  Substance Use Topics  . Smoking status: Never Smoker  . Smokeless tobacco: Never Used  . Alcohol use No   family history includes Alcohol abuse in his paternal grandfather; Colon cancer in his maternal grandfather and paternal grandfather; Crohn's disease in his maternal grandmother; Lung cancer in his paternal grandfather; Pancreatic cancer in his maternal grandmother.  ROS: negative except as noted in the HPI  Medications: No current outpatient prescriptions on file.   No current facility-administered medications for this visit.     Allergies  Allergen Reactions  . Bee Venom        Objective:  BP 123/86   Pulse 71   Ht 6\' 2"  (1.88 m)   Wt 196 lb (88.9 kg)   BMI 25.16 kg/m  Gen: well-groomed, cooperative, not ill-appearing, no distress HEENT: normal conjunctiva, wearing glasses, TM's clear, oropharynx clear, moist mucus membranes, no thyromegaly or tenderness Pulm: Normal work of breathing, clear to auscultation bilaterally CV: Normal rate, regular rhythm, s1 and s2 distinct, no murmurs, clicks or rubs appreciated on this exam, no carotid bruit, distal pulses intact, no peripheral edema GI: soft, nondistended, nontender, no masses Neuro: alert and oriented x 3, EOM's intact, PERRLA, DTR's intact MSK: strength 5/5 and symmetric in bilateral upper and lower extremities, normal gait and station, no spinous process tenderness or step-off deformity, back with full active ROM Skin: warm and dry, no rashes or lesions on exposed skin Psych: normal affect, pleasant mood, normal speech and thought content   Depression screen [facility] 2/9 12/09/2016  Decreased Interest 0  Down, Depressed, Hopeless 0  PHQ - 2 Score 0    Study Result   CLINICAL DATA:  Persistent mid thoracolumbar back pain for 2 months.  EXAM: THORACIC SPINE - 3 VIEWS  COMPARISON:  06/06/2016  FINDINGS: There is no evidence of thoracic spine fracture. Alignment is normal. No other significant bone abnormalities are identified.  IMPRESSION: Negative.   Electronically Signed   By: Judie PetitM.  Shick M.D.   On: 06/06/2016 09:11     Assessment and Plan: 36 y.o. male with   1. Annual physical exam - CBC - Comprehensive metabolic panel - Hemoglobin A1c - Lipid Panel w/reflex Direct LDL - TDaP given - influenza vaccine declined  2. Chronic midline thoracic back pain - negative Tspine xrays - Start Meloxicam 1 tab daily with breakfast - Physical therapy - Follow-up with Dr. Karie Schwalbe if no improvement in 2 months  Patient education and  anticipatory guidance given Patient agrees with treatment plan Follow-up in 1 year or sooner as needed  Levonne Hubertharley E. Kylee Umana PA-C

## 2016-12-09 NOTE — Patient Instructions (Addendum)
For your back pain: Start Meloxicam 1 tab daily with breakfast - Do not take any other OTC pain relievers (ibuprofen, naproxen etc.) - Tylenol is okay Physical therapy Follow-up with Dr. Karie Schwalbe if no improvement in 2 months  Physical Activity Recommendations for modifying lipids and lowering blood pressure Engage in aerobic physical activity to reduce LDL-cholesterol, non-HDL-cholesterol, and blood pressure  Frequency: 3-4 sessions per week  Intensity: moderate to vigorous  Duration: 40 minutes on average  Physical Activity Recommendations for secondary prevention 1. Aerobic exercise  Frequency: 3-5 sessions per week  Intensity: 50-80% capacity  Duration: 20 - 60 minutes  Examples: walking, treadmill, cycling, rowing, stair climbing, and arm/leg ergometry  2. Resistance exercise  Frequency: 2-3 sessions per week  Intensity: 10-15 repetitions/set to moderate fatigue  Duration: 1-3 sets of 8-10 upper and lower body exercises  Examples: calisthenics, elastic bands, cuff/hand weights, dumbbels, free weights, wall pulleys, and weight machines  Heart-Healthy Lifestyle  Eating a diet rich in vegetables, fruits and whole grains: also includes low-fat dairy products, poultry, fish, legumes, and nuts; limit intake of sweets, sugar-sweetened beverages and red meats  Getting regular exercise  Maintaining a healthy weight  Not smoking or getting help quitting  Staying on top of your health; for some people, lifestyle changes alone may not be enough to prevent a heart attack or stroke. In these cases, taking a statin at the right dose will most likely be necessary

## 2016-12-11 LAB — CBC
HCT: 42.1 % (ref 38.5–50.0)
HEMOGLOBIN: 14.5 g/dL (ref 13.2–17.1)
MCH: 28.9 pg (ref 27.0–33.0)
MCHC: 34.4 g/dL (ref 32.0–36.0)
MCV: 84 fL (ref 80.0–100.0)
MPV: 9.7 fL (ref 7.5–12.5)
Platelets: 218 10*3/uL (ref 140–400)
RBC: 5.01 MIL/uL (ref 4.20–5.80)
RDW: 13.2 % (ref 11.0–15.0)
WBC: 6 10*3/uL (ref 3.8–10.8)

## 2016-12-11 LAB — LIPID PANEL W/REFLEX DIRECT LDL
Cholesterol: 190 mg/dL (ref <200)
HDL: 52 mg/dL (ref >40)
LDL-Cholesterol: 117 mg/dL — ABNORMAL HIGH
Non-HDL Cholesterol (Calc): 138 mg/dL — ABNORMAL HIGH (ref <130)
Total CHOL/HDL Ratio: 3.7 Ratio (ref <5.0)
Triglycerides: 106 mg/dL (ref <150)

## 2016-12-11 LAB — COMPREHENSIVE METABOLIC PANEL
ALBUMIN: 4.2 g/dL (ref 3.6–5.1)
ALT: 36 U/L (ref 9–46)
AST: 18 U/L (ref 10–40)
Alkaline Phosphatase: 68 U/L (ref 40–115)
BUN: 15 mg/dL (ref 7–25)
CHLORIDE: 103 mmol/L (ref 98–110)
CO2: 27 mmol/L (ref 20–31)
CREATININE: 1.07 mg/dL (ref 0.60–1.35)
Calcium: 9 mg/dL (ref 8.6–10.3)
Glucose, Bld: 88 mg/dL (ref 65–99)
POTASSIUM: 4.1 mmol/L (ref 3.5–5.3)
SODIUM: 140 mmol/L (ref 135–146)
TOTAL PROTEIN: 7.2 g/dL (ref 6.1–8.1)
Total Bilirubin: 0.9 mg/dL (ref 0.2–1.2)

## 2016-12-11 LAB — HEMOGLOBIN A1C
HEMOGLOBIN A1C: 5 % (ref <5.7)
MEAN PLASMA GLUCOSE: 97 mg/dL

## 2016-12-12 ENCOUNTER — Encounter: Payer: Self-pay | Admitting: Physician Assistant

## 2016-12-12 DIAGNOSIS — E785 Hyperlipidemia, unspecified: Secondary | ICD-10-CM | POA: Insufficient documentation

## 2018-09-14 ENCOUNTER — Encounter: Payer: Self-pay | Admitting: Physician Assistant

## 2018-09-14 ENCOUNTER — Ambulatory Visit (INDEPENDENT_AMBULATORY_CARE_PROVIDER_SITE_OTHER): Payer: 59 | Admitting: Physician Assistant

## 2018-09-14 VITALS — BP 139/76 | HR 79 | Temp 97.5°F | Wt 183.8 lb

## 2018-09-14 DIAGNOSIS — Z113 Encounter for screening for infections with a predominantly sexual mode of transmission: Secondary | ICD-10-CM | POA: Diagnosis not present

## 2018-09-14 DIAGNOSIS — N489 Disorder of penis, unspecified: Secondary | ICD-10-CM | POA: Diagnosis not present

## 2018-09-14 NOTE — Progress Notes (Signed)
HPI:                                                                Fred Elliott is a 37 y.o. male who presents to [facility]: Primary Care Sports Medicine today for skin lesion  Fred Elliott presents with a scan lesion of the shaft of his penis that he is concerned may be a wart. The lesion is asymptomatic, it has been present for several years, it is unchanged. Reports he has been diagnosed with genital warts in the past and had cryotherapy. He is sexually active with 1 male partner, they are monogamous. He would like to be screened for STIs today.   Past Medical History:  Diagnosis Date  . Seizures (HCC)    4th grade, last seizure around age 22   Past Surgical History:  Procedure Laterality Date  . NASAL SINUS SURGERY     Social History   Tobacco Use  . Smoking status: Never Smoker  . Smokeless tobacco: Never Used  Substance Use Topics  . Alcohol use: No   family history includes Alcohol abuse in his paternal grandfather; Colon cancer in his maternal grandfather and paternal grandfather; Crohn's disease in his maternal grandmother; Lung cancer in his paternal grandfather; Pancreatic cancer in his maternal grandmother.    ROS: negative except as noted in the HPI  Medications: No current outpatient medications on file.   No current facility-administered medications for this visit.    Allergies  Allergen Reactions  . Bee Venom        Objective:  BP 139/76   Pulse 79   Temp (!) 97.5 F (36.4 C) (Oral)   Wt 183 lb 12.8 oz (83.4 kg)   BMI 23.60 kg/m  Gen:  alert, not ill-appearing, no distress, appropriate for age HEENT: head normocephalic without obvious abnormality, conjunctiva and cornea clear, trachea midline Pulm: Normal work of breathing, normal phonation Neuro: alert and oriented x 3, no tremor MSK: extremities atraumatic, normal gait and station Skin: flesh colored papule measuring approx 1-2 mm on right mid-shaft of penis  A chaperone  was used for the exam and procedure, Fred Elliott, CMA  Procedure: Cryodestruction of penile lesion Indication: infection Risks and benefits of procedures discussed. Verbal consent obtained Area examined and noted no overlying erythema, induration, or other signs of local skin infection. Using cryospray method, the canister was held in an upright position with the cryotip approximately 1 cm away from the lesion.  Freezing performed using liquid nitrogen until 1-2 mm rim of frost appeared around the lesion.  Freeze-thaw cycle repeated once.  Follow-up: The patient tolerated the procedure well without complications.  Standard post-procedure care is explained and return precautions are given. Advised to return in 1-2 weeks for repeat cryodestruction if lesion is still present/symptomatic.   No results found for this or any previous visit (from the past 72 hour(s)). No results found.    Assessment and Plan: 37 y.o. male with   .Diagnoses and all orders for this visit:  Penile lesion  Routine screening for STI (sexually transmitted infection) -     C. trachomatis/N. gonorrhoeae RNA -     Hepatitis C antibody -     HIV Antibody (routine testing w rflx) -  RPR   Penile lesion - genital wart is most likely diagnosis. Does not have features consistent with lichen, psoriasis, or carcinoma - cryotherapy performed in office today (see procedure note)    Patient education and anticipatory guidance given Patient agrees with treatment plan Follow-up as needed in 2 weeks for repeat cryotherapy or sooner if symptoms worsen or fail to improve  Fred Elliott.  PA-C

## 2018-09-14 NOTE — Patient Instructions (Addendum)
Cryosurgery for Skin Conditions, Care After This sheet gives you information about how to care for yourself after your procedure. Your health care provider may also give you more specific instructions. If you have problems or questions, contact your health care provider. What can I expect after the procedure? After your procedure, it is common to have redness, swelling, and a blister that forms over the treated area. The blister may contain a small amount of blood. After about 2 weeks, the blister will break on its own, leaving a scab. Then the treated area will heal. After healing, there is usually little or no scarring. Follow these instructions at home: Caring for the treated area  Follow instructions from your health care provider about how to take care of the treated area. Make sure you: ? Keep the area covered with a bandage (dressing) until it heals, or for as long as told by your health care provider. ? Wash your hands with soap and water before you change your dressing. If soap and water are not available, use hand sanitizer. ? Change your dressing as told by your health care provider. ? Keep the dressing and the treated area clean and dry. If the dressing gets wet, change it right away. ? Clean the treated area with soap and water.  Check the treated area every day for signs of infection. Check for: ? More redness, swelling, or pain. ? More fluid or blood. ? Warmth. ? Pus or a bad smell. General instructions  Do not pick at your blister or try to break it open. This can cause infection and scarring.  Do not apply any medicine, cream, or lotion to the treated area unless directed by your health care provider.  Take over-the-counter and prescription medicines only as told by your health care provider.  Keep all follow-up visits as told by your health care provider. This is important. Contact a health care provider if:  You have more redness, swelling, or pain around the treated  area.  You have more fluid or blood coming from the treated area.  The treated area feels warm to the touch.  You have pus or a bad smell coming from the treated area.  Your blister becomes large and painful. Get help right away if:  You have a fever and have redness spreading from the treated area. Summary  The treated area will become red and swollen shortly after the procedure.  You should keep the treated area and your dressing clean and dry.  Check the treated area every day for signs of infection, such as fluid, pus, warmth, or having more redness, swelling, or pain.  Do not pick at your blister or try to break it open. This information is not intended to replace advice given to you by your health care provider. Make sure you discuss any questions you have with your health care provider. Document Released: 04/26/2005 Document Revised: 08/26/2016 Document Reviewed: 08/26/2016 Elsevier Interactive Patient Education  2017 Elsevier Inc.    Genital Warts Genital warts are a common STD (sexually transmitted disease). They may appear as small bumps on the tissues of the genital area or anal area. Sometimes, they can become irritated and cause pain. Genital warts are easily passed to other people through sexual contact. Getting treatment is important because genital warts can lead to other problems. In females, the virus that causes genital warts may increase the risk of cervical cancer. What are the causes? Genital warts are caused by a virus that is  called human papillomavirus (HPV). HPV is spread by having unprotected sex with an infected person. It can be spread through vaginal, anal, and oral sex. Many people do not know that they are infected. They may be infected for years without problems. However, even if they do not have problems, they can pass the infection to their sexual partners. What increases the risk? Genital warts are more likely to develop in:  People who have  unprotected sex.  People who have multiple sexual partners.  People who become sexually active before they are 36 years of age.  Men who are not circumcised.  Women who have a male sexual partner who is not circumcised.  People who have a weakened body defense system (immune system) due to disease or medicine.  People who smoke.  What are the signs or symptoms? Symptoms of genital warts include:  Small growths in the genital area or anal area. These warts often grow in clusters.  Itching and irritation in the genital area or anal area.  Bleeding from the warts.  Painful sexual intercourse.  How is this diagnosed? Genital warts can usually be diagnosed from their appearance on the vagina, vulva, penis, perineum, anus, or rectum. Tests may also be done, such as:  Biopsy. A tissue sample is removed so it can be looked at under a microscope.  Colposcopy. In females, a magnifying tool is used to examine the vagina and cervix. Certain solutions may be used to make the HPV cells change color so they can be seen more easily.  A Pap test in females.  Tests for other STDs.  How is this treated? Treatment for genital warts may include:  Applying prescription medicines to the warts. These may be solutions or creams.  Freezing the warts with liquid nitrogen (cryotherapy).  Burning the warts with: ? Laser treatment. ? An electrified probe (electrocautery).  Injecting a substance (Candida antigen or Trichophyton antigen) into the warts to help the body's immune system to fight off the warts.  Interferon injections.  Surgery to remove the warts.  Follow these instructions at home: Medicines  Apply over-the-counter and prescription medicines only as told by your health care provider.  Do not treat genital warts with medicines that are used for treating hand warts.  Talk with your health care provider about using over-the-counter anti-itch creams. General instructions  Do  not touch or scratch the warts.  Do not have sex until your treatment has been completed.  Tell your current and past sexual partners about your condition because they may also need treatment.  Keep all follow-up visits as told by your health care provider. This is important.  After treatment, use condoms during sex to prevent future infections. Other Instructions for Women  Women who have genital warts might need increased screening for cervical cancer. This type of cancer is slow growing and can be cured if it is found early. Chances of developing cervical cancer are increased with HPV.  If you become pregnant, tell your health care provider that you have had HPV. Your health care provider will monitor you closely during pregnancy to be sure that your baby is safe. How is this prevented? Talk with your health care provider about getting the HPV vaccines. These vaccines prevent some HPV infections and cancers. It is recommended that the vaccine be given to males and females who are 56-65 years of age. It will not work if you already have HPV, and it is not recommended for pregnant women. Contact a  health care provider if:  You have redness, swelling, or pain in the area of the treated skin.  You have a fever.  You feel generally ill.  You feel lumps in and around your genital area or anal area.  You have bleeding in your genital area or anal area.  You have pain during sexual intercourse. This information is not intended to replace advice given to you by your health care provider. Make sure you discuss any questions you have with your health care provider. Document Released: 10/04/2000 Document Revised: 03/14/2016 Document Reviewed: 01/02/2015 Elsevier Interactive Patient Education  [employer]2018 Elsevier Inc.

## 2018-09-15 LAB — C. TRACHOMATIS/N. GONORRHOEAE RNA
C. trachomatis RNA, TMA: NOT DETECTED
N. gonorrhoeae RNA, TMA: NOT DETECTED

## 2018-09-15 LAB — HEPATITIS C ANTIBODY
Hepatitis C Ab: NONREACTIVE
SIGNAL TO CUT-OFF: 0.02 (ref <1.00)

## 2018-09-15 LAB — RPR: RPR: NONREACTIVE

## 2018-09-15 LAB — HIV ANTIBODY (ROUTINE TESTING W REFLEX): HIV 1&2 Ab, 4th Generation: NONREACTIVE

## 2018-09-23 ENCOUNTER — Ambulatory Visit (INDEPENDENT_AMBULATORY_CARE_PROVIDER_SITE_OTHER): Payer: 59 | Admitting: Sports Medicine

## 2018-09-23 VITALS — BP 136/77 | HR 74 | Wt 187.0 lb

## 2018-09-23 DIAGNOSIS — N489 Disorder of penis, unspecified: Secondary | ICD-10-CM

## 2018-09-23 NOTE — Progress Notes (Signed)
   Procedure: Shave biopsy of 0.3cm right penile shaft skin lesion Risks, benefits, and alternatives explained and consent obtained. Time out conducted. Surface prepped with alcohol. 0.5cc lidocaine with epinephine infiltrated in a field block. Adequate anesthesia ensured. Area prepped and draped in a sterile fashion. Excision performed with: Using a derma blade I shaved the lesion off into the dermis, and then used a Hyfrecator to achieve hemostasis. Pt stable. ____________________________________________________________________________________  Penile lesion I think this is more of a skin tag rather than a genital wart. We are still going to send off to pathology per patient request. Return to see me in 1 week for wound check, no sexual activity or masturbation for 2 weeks.

## 2018-09-23 NOTE — Assessment & Plan Note (Signed)
I think this is more of a skin tag rather than a genital wart. We are still going to send off to pathology per patient request. Return to see me in 1 week for wound check, no sexual activity or masturbation for 2 weeks.

## 2018-09-23 NOTE — Patient Instructions (Signed)
Apply Band-Aid daily with a dab of Neosporin. Return to see me in 1 week for wound check. No sexual activity for at least 2 weeks.

## 2018-09-23 NOTE — Progress Notes (Signed)
HPI:                                                                Fred Elliott is a 37 y.o. male [facility] today for follow-up penile lesion  Fred Spryan has had a persistent, asymptomatic skin lesion on the shaft of his penis for several years. He was diagnosed with genital warts at one time. He underwent cryotherapy about 10 days ago and reports no change in the lesion. He really wants to confirm the diagnosis and have the lesion removed.     Past Medical History:  Diagnosis Date  . Seizures (HCC)    4th grade, last seizure around age 37   Past Surgical History:  Procedure Laterality Date  . NASAL SINUS SURGERY     Social History   Tobacco Use  . Smoking status: Never Smoker  . Smokeless tobacco: Never Used  Substance Use Topics  . Alcohol use: No   family history includes Alcohol abuse in his paternal grandfather; Colon cancer in his maternal grandfather and paternal grandfather; Crohn's disease in his maternal grandmother; Lung cancer in his paternal grandfather; Pancreatic cancer in his maternal grandmother.    ROS: negative except as noted in the HPI  Medications: No current outpatient medications on file.   No current facility-administered medications for this visit.    Allergies  Allergen Reactions  . Bee Venom        Objective:  BP 136/77   Pulse 74   Wt 187 lb (84.8 kg)   BMI 24.01 kg/m    No results found for this or any previous visit (from the past 72 hour(s)). No results found.    Assessment and Plan: 37 y.o. male with   Dr. Benjamin Elliott consulted for shave biopsy due to sensitive area with concern for bleeding and cosmesis See procedure note Lesion sent to pathology    Patient education and anticipatory guidance given Patient agrees with treatment plan Follow-up as needed if symptoms worsen or fail to improve  Fred Hubertharley E. Cummings PA-C

## 2018-09-24 ENCOUNTER — Encounter: Payer: Self-pay | Admitting: Sports Medicine

## 2018-09-25 ENCOUNTER — Encounter: Payer: Self-pay | Admitting: Physician Assistant

## 2018-09-30 ENCOUNTER — Ambulatory Visit (INDEPENDENT_AMBULATORY_CARE_PROVIDER_SITE_OTHER): Payer: 59 | Admitting: Sports Medicine

## 2018-09-30 ENCOUNTER — Encounter: Payer: Self-pay | Admitting: Sports Medicine

## 2018-09-30 VITALS — BP 119/74 | HR 69 | Ht 74.0 in | Wt 186.0 lb

## 2018-09-30 DIAGNOSIS — N489 Disorder of penis, unspecified: Secondary | ICD-10-CM

## 2018-09-30 DIAGNOSIS — Z23 Encounter for immunization: Secondary | ICD-10-CM

## 2018-09-30 MED ORDER — HPV QUADRIVALENT VACCINE IM SUSP: 0.5000 mL | Freq: Once | INTRAMUSCULAR | 0 refills | Status: DC

## 2018-09-30 NOTE — Progress Notes (Signed)
  Subjective: 1 week for shave biopsy of a lesion on the shaft of the penis.  Pathology showed condyloma acuminata.  Objective: General: Well-developed, well-nourished, and in no acute distress. Penis: Skin is intact, healed well.   Assessment/plan:   Penile lesion Shave biopsy at the last visit with hyfrecation, pathology shows condyloma acuminatum. Return as needed. Further management per primary treating provider. ___________________________________________ Ihor Austinhomas J. Benjamin Stainhekkekandam, M.D., ABFM., CAQSM. Primary Care and Sports Medicine [facility]  Adjunct Instructor of Family Medicine  [employer]

## 2018-09-30 NOTE — Addendum Note (Signed)
Addended by: Donne AnonBENDER, Der Gagliano L on: 09/30/2018 11:45 AM   Modules accepted: Orders

## 2018-09-30 NOTE — Assessment & Plan Note (Signed)
Shave biopsy at the last visit with hyfrecation, pathology shows condyloma acuminatum. Return as needed. Further management per primary treating provider.

## 2018-10-31 ENCOUNTER — Emergency Department (INDEPENDENT_AMBULATORY_CARE_PROVIDER_SITE_OTHER): Payer: 59

## 2018-10-31 ENCOUNTER — Emergency Department (INDEPENDENT_AMBULATORY_CARE_PROVIDER_SITE_OTHER)
Admission: EM | Admit: 2018-10-31 | Discharge: 2018-10-31 | Disposition: A | Discharge status: Home / Self Care | Payer: 59 | Source: Home / Self Care | Attending: Family Medicine | Admitting: Family Medicine

## 2018-10-31 ENCOUNTER — Encounter: Payer: Self-pay | Admitting: Emergency Medicine

## 2018-10-31 DIAGNOSIS — R6889 Other general symptoms and signs: Secondary | ICD-10-CM | POA: Diagnosis not present

## 2018-10-31 DIAGNOSIS — R51 Headache: Secondary | ICD-10-CM | POA: Diagnosis not present

## 2018-10-31 DIAGNOSIS — R05 Cough: Secondary | ICD-10-CM

## 2018-10-31 MED ORDER — AZITHROMYCIN 250 MG PO TABS: 250.0000 mg | ORAL_TABLET | Freq: Every day | ORAL | 0 refills | Status: DC

## 2018-10-31 NOTE — ED Triage Notes (Signed)
Patient c/o concern for pneumonia, been feeling sick since Monday, fatigue, headache, productive cough, head congestion, runny nose, night sweats x 3 days.

## 2018-10-31 NOTE — ED Provider Notes (Signed)
[facility]    CSN: [id number] Arrival date & time: 10/31/18  0901     History   Chief Complaint Chief Complaint  Patient presents with  . URI    HPI Fred Elliott is a 38 y.o. male.   HPI  Fred Elliott is a 38 y.o. male presenting to UC with concern for pneumonia after feeling sick for 1 week.  Pt c/o fatigue, HA, productive cough and head congestion with rhinorrhea. The last 3 days he has developed night sweats. Hx of walking pneumonia several years ago when in college. No hx of asthma. Denies known fever. Denies n/v/d. No chest pain or SOB.   Past Medical History:  Diagnosis Date  . Seizures (HCC)    4th grade, last seizure around age 2    Patient Active Problem List   Diagnosis Date Noted  . Penile lesion 09/14/2018  . Dyslipidemia 12/12/2016  . 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 Fracture of two ribs, right, closed, initial encounter 11/20/2016  . Mid back pain 06/06/2016  . Left lumbar radiculopathy 09/19/2014  . Patellar tendinitis of right knee 06/28/2014    Past Surgical History:  Procedure Laterality Date  . NASAL SINUS SURGERY         Home Medications    Prior to Admission medications   Medication Sig Start Date End Date Taking? Authorizing Provider  doxycycline (VIBRAMYCIN) 100 MG capsule  09/23/18  Yes [provider]  azithromycin (ZITHROMAX) 250 MG tablet Take 1 tablet (250 mg total) by mouth daily. Take first 2 tablets together, then 1 every day until finished. 10/31/18   Lurene Shadow, PA-C    Family History Family History  Problem Relation Age of Onset  . Crohn's disease Maternal Grandmother   . Pancreatic cancer Maternal Grandmother   . Colon cancer Maternal Grandfather   . Alcohol abuse Paternal Grandfather   . Lung cancer Paternal Grandfather   . Colon cancer Paternal Grandfather     Social History Social History   Tobacco Use  . Smoking status: Never Smoker  . Smokeless tobacco: Never Used    Substance Use Topics  . Alcohol use: No  . Drug use: No     Allergies   Bee venom   Review of Systems Review of Systems  Constitutional: Positive for fatigue and fever. Negative for chills.  HENT: Positive for congestion and rhinorrhea. Negative for ear pain, sore throat, trouble swallowing and voice change.   Respiratory: Positive for cough. Negative for shortness of breath.   Cardiovascular: Negative for chest pain and palpitations.  Gastrointestinal: Negative for abdominal pain, diarrhea, nausea and vomiting.  Musculoskeletal: Negative for arthralgias, back pain and myalgias.  Skin: Negative for rash.  Neurological: Positive for headaches. Negative for dizziness and light-headedness.     Physical Exam Triage Vital Signs ED Triage Vitals [10/31/18 0925]  Enc Vitals Group     BP 140/80     Pulse Rate 87     Resp      Temp 98.2 F (36.8 C)     Temp Source Oral     SpO2 99 %     Weight 184 lb 12 oz (83.8 kg)     Height 6\' 2"  (1.88 m)     Head Circumference      Peak Flow      Pain Score 0     Pain Loc      Pain Edu?      Excl. in GC?  No data found.  Updated Vital Signs BP 140/80 (BP Location: Right Arm)   Pulse 87   Temp 98.2 F (36.8 C) (Oral)   Ht 6\' 2"  (1.88 m)   Wt 184 lb 12 oz (83.8 kg)   SpO2 99%   BMI 23.72 kg/m   Visual Acuity Right Eye Distance:   Left Eye Distance:   Bilateral Distance:    Right Eye Near:   Left Eye Near:    Bilateral Near:     Physical Exam Vitals signs and nursing note reviewed.  Constitutional:      Appearance: Normal appearance. He is well-developed.  HENT:     Head: Normocephalic and atraumatic.     Right Ear: Tympanic membrane normal.     Left Ear: Tympanic membrane normal.     Nose: Congestion present.     Right Sinus: No maxillary sinus tenderness or frontal sinus tenderness.     Left Sinus: No maxillary sinus tenderness or frontal sinus tenderness.     Mouth/Throat:     Lips: Pink.     Mouth: Mucous  membranes are moist.     Pharynx: Oropharynx is clear. Uvula midline. No pharyngeal swelling, oropharyngeal exudate, posterior oropharyngeal erythema or uvula swelling.  Neck:     Musculoskeletal: Normal range of motion.  Cardiovascular:     Rate and Rhythm: Normal rate and regular rhythm.  Pulmonary:     Effort: Pulmonary effort is normal. No respiratory distress.     Breath sounds: No stridor. Rhonchi present. No wheezing.  Musculoskeletal: Normal range of motion.  Skin:    General: Skin is warm and dry.  Neurological:     Mental Status: He is alert and oriented to person, place, and time.  Psychiatric:        Behavior: Behavior normal.      UC Treatments / Results  Labs (all labs ordered are listed, but only abnormal results are displayed) Labs Reviewed - No data to display  EKG None  Radiology Dg Chest 2 View  Result Date: 10/31/2018 CLINICAL DATA:  Patient with fatigue, headache and productive cough EXAM: CHEST - 2 VIEW COMPARISON:  Chest radiograph 11/20/2016 FINDINGS: Normal cardiac and mediastinal contours. No consolidative pulmonary opacities. No pleural effusion or pneumothorax. IMPRESSION: No active cardiopulmonary disease. Electronically Signed   By: Annia Belt M.D.   On: 10/31/2018 09:59    Procedures Procedures (including critical care time)  Medications Ordered in UC Medications - No data to display  Initial Impression / Assessment and Plan / UC Course  I have reviewed the triage vital signs and the nursing notes.  Pertinent labs & imaging results that were available during my care of the patient were reviewed by me and considered in my medical decision making (see chart for details).     Reviewed imaging with pt No evidence of pneumonia at this time. Symptoms likely viral Encouraged symptomatic treatment Prescription to hold with expiration date for azithromycin. Pt to fill if persistent fever develops or not improving in 1 week.     Final  Clinical Impressions(s) / UC Diagnoses   Final diagnoses:  Flu-like symptoms     Discharge Instructions      You may take 500mg  acetaminophen every 4-6 hours or in combination with ibuprofen 400-600mg  every 6-8 hours as needed for pain, inflammation, and fever.  Be sure to well hydrated with clear liquids and get at least 8 hours of sleep at night, preferably more while sick.   Please follow  up with family medicine in 1 week if needed.  Your symptoms are likely due to a virus such as the common cold or possibly the flu, however, if you developing worsening chest congestion with shortness of breath, persistent fever (>100.4*F) for 3 days, or symptoms not improving in 4-5 days, you may fill the antibiotic (azithromycin).  If you do fill the antibiotic,  please take antibiotics as prescribed and be sure to complete entire course even if you start to feel better to ensure infection does not come back.     ED Prescriptions    Medication Sig Dispense Auth. Provider   azithromycin (ZITHROMAX) 250 MG tablet Take 1 tablet (250 mg total) by mouth daily. Take first 2 tablets together, then 1 every day until finished. 6 tablet Lurene ShadowPhelps, Ciela Mahajan O, PA-C     Controlled Substance Prescriptions [place] Controlled Substance Registry consulted? Not Applicable   Rolla Platehelps, Johnika Escareno O, PA-C 11/01/18 1233

## 2018-10-31 NOTE — Discharge Instructions (Signed)
°  You may take 500mg  acetaminophen every 4-6 hours or in combination with ibuprofen 400-600mg  every 6-8 hours as needed for pain, inflammation, and fever.  Be sure to well hydrated with clear liquids and get at least 8 hours of sleep at night, preferably more while sick.   Please follow up with family medicine in 1 week if needed.  Your symptoms are likely due to a virus such as the common cold or possibly the flu, however, if you developing worsening chest congestion with shortness of breath, persistent fever (>100.4*F) for 3 days, or symptoms not improving in 4-5 days, you may fill the antibiotic (azithromycin).  If you do fill the antibiotic,  please take antibiotics as prescribed and be sure to complete entire course even if you start to feel better to ensure infection does not come back.

## 2018-12-01 ENCOUNTER — Ambulatory Visit: Payer: 59

## 2018-12-03 ENCOUNTER — Ambulatory Visit (INDEPENDENT_AMBULATORY_CARE_PROVIDER_SITE_OTHER): Payer: 59 | Admitting: Physician Assistant

## 2018-12-03 ENCOUNTER — Encounter: Payer: Self-pay | Admitting: Physician Assistant

## 2018-12-03 VITALS — BP 124/69 | HR 93 | Temp 98.5°F | Wt 187.0 lb

## 2018-12-03 DIAGNOSIS — R053 Chronic cough: Secondary | ICD-10-CM

## 2018-12-03 DIAGNOSIS — R05 Cough: Secondary | ICD-10-CM | POA: Diagnosis not present

## 2018-12-03 MED ORDER — CEFUROXIME AXETIL 250 MG PO TABS: 250.0000 mg | ORAL_TABLET | Freq: Two times a day (BID) | ORAL | 0 refills | Status: AC

## 2018-12-03 NOTE — Patient Instructions (Addendum)
For cough: - Cough is a protective mechanism and an important part of fighting an infection. I encourage you to avoid suppressing your cough with medication during the day if possible.  - Mucinex with at least 8 oz. of water can loosen chest congestion and make cough more productive, which means you will actually cough less - Okay to use a cough suppressant at bedtime in order to rest (Nyquil, Delsym, Robitussin, etc.)  Note: follow package instructions for all over-the-counter medications. If using multi-symptom medications (Dayquil, Theraflu, etc.), check the label for duplicate drug ingredients.    Cough, Adult  Coughing is a reflex that clears your throat and your airways. Coughing helps to heal and protect your lungs. It is normal to cough occasionally, but a cough that happens with other symptoms or lasts a long time may be a sign of a condition that needs treatment. A cough may last only 2-3 weeks (acute), or it may last longer than 8 weeks (chronic). What are the causes? Coughing is commonly caused by:  Breathing in substances that irritate your lungs.  A viral or bacterial respiratory infection.  Allergies.  Asthma.  Postnasal drip.  Smoking.  Acid backing up from the stomach into the esophagus (gastroesophageal reflux).  Certain medicines.  Chronic lung problems, including COPD (or rarely, lung cancer).  Other medical conditions such as heart failure. Follow these instructions at home: Pay attention to any changes in your symptoms. Take these actions to help with your discomfort:  Take medicines only as told by your health care provider. ? If you were prescribed an antibiotic medicine, take it as told by your health care provider. Do not stop taking the antibiotic even if you start to feel better. ? Talk with your health care provider before you take a cough suppressant medicine.  Drink enough fluid to keep your urine clear or pale yellow.  If the air is dry, use  a cold steam vaporizer or humidifier in your bedroom or your home to help loosen secretions.  Avoid anything that causes you to cough at work or at home.  If your cough is worse at night, try sleeping in a semi-upright position.  Avoid cigarette smoke. If you smoke, quit smoking. If you need help quitting, ask your health care provider.  Avoid caffeine.  Avoid alcohol.  Rest as needed. Contact a health care provider if:  You have new symptoms.  You cough up pus.  Your cough does not get better after 2-3 weeks, or your cough gets worse.  You cannot control your cough with suppressant medicines and you are losing sleep.  You develop pain that is getting worse or pain that is not controlled with pain medicines.  You have a fever.  You have unexplained weight loss.  You have night sweats. Get help right away if:  You cough up blood.  You have difficulty breathing.  Your heartbeat is very fast. This information is not intended to replace advice given to you by your health care provider. Make sure you discuss any questions you have with your health care provider. Document Released: 04/05/2011 Document Revised: 03/14/2016 Document Reviewed: 12/14/2014 Elsevier Interactive Patient Education  2019 [employer].

## 2018-12-03 NOTE — Progress Notes (Signed)
HPI:                                                                Fred Elliott is a 38 y.o. male who [facility] today for cough  Presented approx 1 month ago and was clinically diagnosed with influenza. States symptoms never really went away and then he describes second sickening this Tuesday while traveling back on a ski trip. He states [place] in [place] and was skiing for 6 days. Complains of cough, myalgias, chills and increased mucus in his throat today. Denies fever, dyspnea, chest pain.    Past Medical History:  Diagnosis Date  . Seizures (HCC)    4th grade, last seizure around age 38   Past Surgical History:  Procedure Laterality Date  . NASAL SINUS SURGERY     Social History   Tobacco Use  . Smoking status: Never Smoker  . Smokeless tobacco: Never Used  Substance Use Topics  . Alcohol use: No   family history includes Alcohol abuse in his paternal grandfather; Colon cancer in his maternal grandfather and paternal grandfather; Crohn's disease in his maternal grandmother; Lung cancer in his paternal grandfather; Pancreatic cancer in his maternal grandmother.    ROS: negative except as noted in the HPI  Medications: Current Outpatient Medications  Medication Sig Dispense Refill  . cefUROXime (CEFTIN) 250 MG tablet Take 1 tablet (250 mg total) by mouth 2 (two) times daily with a meal for 7 days. 14 tablet 0   No current facility-administered medications for this visit.    Allergies  Allergen Reactions  . Bee Venom        Objective:  BP 124/69   Pulse 93   Temp 98.5 F (36.9 C) (Oral)   Wt 187 lb (84.8 kg)   SpO2 96%   BMI 24.01 kg/m  Gen:  alert, not ill-appearing, no distress, appropriate for age HEENT: head normocephalic without obvious abnormality, conjunctiva and cornea clear, external ear canals normal bilaterally, tympanic membranes pearly gray and semitransparent  bilaterally, nasal mucosa pink, no sinus tenderness, oropharynx clear, uvula midline, neck supple, no cervical adenopathy trachea midline Pulm: Normal work of breathing, normal phonation, clear to auscultation bilaterally, no wheezes, rales or rhonchi CV: Normal rate, regular rhythm, s1 and s2 distinct, no murmurs, clicks or rubs  Neuro: alert and oriented x 3, no tremor MSK: extremities atraumatic, normal gait and station Skin: intact, redness of nasal apex Study Result   CLINICAL DATA:  Patient with fatigue, headache and productive cough  EXAM: CHEST - 2 VIEW  COMPARISON:  Chest radiograph 11/20/2016  FINDINGS: Normal cardiac and mediastinal contours. No consolidative pulmonary opacities. No pleural effusion or pneumothorax.  IMPRESSION: No active cardiopulmonary disease.   Electronically Signed   By: Annia Beltrew  Davis M.D.   On: 10/31/2018 09:59     No results found for this or any previous visit (from the past 72 hour(s)). No results found.    Assessment and Plan: 38 y.o. male with   .Melanee Spryan was seen today for cough.  Diagnoses and all orders for this visit:  Persistent cough for 3 weeks or longer -     cefUROXime (CEFTIN) 250 MG tablet; Take 1 tablet (250 mg  total) by mouth 2 (two) times daily with a meal for 7 days.   Afebrile, no tachypnea, no tachycardia, pulse ox mildly reduced 96% on RA at rest, no adventitious lung sounds  Patient describes symptoms of second sickening concerning for secondary bacterial infection due to viral respiratory infection  Recent negative CXR 1 month ago, will defer today since vitals are stable Treating empirically with Cefuroxime Mucinex Counseled on supportive care  Patient education and anticipatory guidance given Patient agrees with treatment plan Follow-up as needed if symptoms worsen or fail to improve  Levonne Hubert PA-C

## 2018-12-04 ENCOUNTER — Ambulatory Visit: Payer: 59

## 2018-12-09 ENCOUNTER — Ambulatory Visit (INDEPENDENT_AMBULATORY_CARE_PROVIDER_SITE_OTHER): Payer: 59 | Admitting: Physician Assistant

## 2018-12-09 VITALS — BP 128/80 | HR 71 | Temp 98.2°F | Wt 186.0 lb

## 2018-12-09 DIAGNOSIS — Z23 Encounter for immunization: Secondary | ICD-10-CM | POA: Diagnosis not present

## 2018-12-09 NOTE — Progress Notes (Signed)
Pt in office today for HPV-9 vaccine. This is his 2nd vaccine for this series. Injection was given in left deltoid. Pt tolerated injection, without any immediate reactions. Pt advised to return in 5 months.

## 2019-03-01 ENCOUNTER — Encounter: Payer: Self-pay | Admitting: Sports Medicine

## 2019-03-01 ENCOUNTER — Ambulatory Visit (INDEPENDENT_AMBULATORY_CARE_PROVIDER_SITE_OTHER): Payer: 59 | Admitting: Sports Medicine

## 2019-03-01 DIAGNOSIS — M549 Dorsalgia, unspecified: Secondary | ICD-10-CM | POA: Diagnosis not present

## 2019-03-01 MED ORDER — PREDNISONE 50 MG PO TABS: ORAL_TABLET | ORAL | 0 refills | Status: DC

## 2019-03-01 NOTE — Progress Notes (Signed)
Subjective:    CC: Mid back pain  HPI: Several days ago Cristen did a pretty aggressive back and shoulder workout, consisting of dead lifts, pull downs, rows.  Since then he has had pain in his mid back, bilateral, just below the shoulder blades.  Pain is moderate, persistent, localized without radiation, it is a little bit worse with Valsalva, not worse with sitting or flexion.  Occasionally worse with twisting to the right.  No bowel or bladder dysfunction, saddle numbness, no constitutional symptoms.  I reviewed the past medical history, family history, social history, surgical history, and allergies today and no changes were needed.  Please see the problem list section below in epic for further details.  Past Medical History: Past Medical History:  Diagnosis Date  . Seizures (HCC)    4th grade, last seizure around age 45   Past Surgical History: Past Surgical History:  Procedure Laterality Date  . NASAL SINUS SURGERY     Social History: Social History   Socioeconomic History  . Marital status: Single    Spouse name: Not on file  . Number of children: Not on file  . Years of education: Not on file  . Highest education level: Not on file  Occupational History  . Not on file  Social Needs  . Financial resource strain: Not on file  . Food insecurity:    Worry: Not on file    Inability: Not on file  . Transportation needs:    Medical: Not on file    Non-medical: Not on file  Tobacco Use  . Smoking status: Never Smoker  . Smokeless tobacco: Never Used  Substance and Sexual Activity  . Alcohol use: No  . Drug use: No  . Sexual activity: Yes    Birth control/protection: None  Lifestyle  . Physical activity:    Days per week: Not on file    Minutes per session: Not on file  . Stress: Not on file  Relationships  . Social connections:    Talks on phone: Not on file    Gets together: Not on file    Attends religious service: Not on file    Active member of club or  organization: Not on file    Attends meetings of clubs or organizations: Not on file    Relationship status: Not on file  Other Topics Concern  . Not on file  Social History Narrative  . Not on file   Family History: Family History  Problem Relation Age of Onset  . Crohn's disease Maternal Grandmother   . Pancreatic cancer Maternal Grandmother   . Colon cancer Maternal Grandfather   . Alcohol abuse Paternal Grandfather   . Lung cancer Paternal Grandfather   . Colon cancer Paternal Grandfather    Allergies: Allergies  Allergen Reactions  . Bee Venom    Medications: See med rec.  Review of Systems: No fevers, chills, night sweats, weight loss, chest pain, or shortness of breath.   Objective:    General: Well Developed, well nourished, and in no acute distress.  Neuro: Alert and oriented x3, extra-ocular muscles intact, sensation grossly intact.  HEENT: Normocephalic, atraumatic, pupils equal round reactive to light, neck supple, no masses, no lymphadenopathy, thyroid nonpalpable.  Skin: Warm and dry, no rashes. Cardiac: Regular rate and rhythm, no murmurs rubs or gallops, no lower extremity edema.  Respiratory: Clear to auscultation bilaterally. Not using accessory muscles, speaking in full sentences. Back Exam:  Inspection: Unremarkable  Motion: Flexion 45 deg,  Extension 45 deg, Side Bending to 45 deg bilaterally,  Rotation to 45 deg bilaterally  SLR laying: Negative  XSLR laying: Negative  Palpable tenderness: None. FABER: negative. Sensory change: Gross sensation intact to all lumbar and sacral dermatomes.  Reflexes: 2+ at both patellar tendons, 2+ at achilles tendons, Babinski's downgoing.  Strength at foot  Plantar-flexion: 5/5 Dorsi-flexion: 5/5 Eversion: 5/5 Inversion: 5/5  Leg strength  Quad: 5/5 Hamstring: 5/5 Hip flexor: 5/5 Hip abductors: 5/5  Gait unremarkable.  Impression and Recommendations:    Mid-back pain, acute Occurred after a heavy back  work-out. This is either a muscle strain or a mild disc protrusion. We will treat this with prednisone for 5 days, activity modification, rehab exercises for the back and rhomboids. Return to see me if no better in 2 weeks and we will consider formal PT versus MRI.   ___________________________________________ Ihor Austinhomas J. Benjamin Stainhekkekandam, M.D., ABFM., CAQSM. Primary Care and Sports Medicine [facility]  Adjunct Professor of Family Medicine  [employer]

## 2019-03-01 NOTE — Assessment & Plan Note (Signed)
Occurred after a heavy back work-out. This is either a muscle strain or a mild disc protrusion. We will treat this with prednisone for 5 days, activity modification, rehab exercises for the back and rhomboids. Return to see me if no better in 2 weeks and we will consider formal PT versus MRI.

## 2019-04-01 ENCOUNTER — Ambulatory Visit (INDEPENDENT_AMBULATORY_CARE_PROVIDER_SITE_OTHER): Payer: 59 | Admitting: Physician Assistant

## 2019-04-01 VITALS — Temp 98.2°F

## 2019-04-01 DIAGNOSIS — Z23 Encounter for immunization: Secondary | ICD-10-CM | POA: Diagnosis not present

## 2019-04-01 NOTE — Progress Notes (Signed)
Established Patient Office Visit  Subjective:  Patient ID: Lenard Kampf, male    DOB: 1981/05/19  Age: 38 y.o. MRN: [id number]  CC:  Chief Complaint  Patient presents with  . Immunizations    HPI Brahm Barbeau presents for last HPV.   Past Medical History:  Diagnosis Date  . Seizures ([place])    4th grade, last seizure around age 27    Past Surgical History:  Procedure Laterality Date  . NASAL SINUS SURGERY      Family History  Problem Relation Age of Onset  . Crohn's disease Maternal Grandmother   . Pancreatic cancer Maternal Grandmother   . Colon cancer Maternal Grandfather   . Alcohol abuse Paternal Grandfather   . Lung cancer Paternal Grandfather   . Colon cancer Paternal Grandfather     Social History   Socioeconomic History  . Marital status: Single    Spouse name: Not on file  . Number of children: Not on file  . Years of education: Not on file  . Highest education level: Not on file  Occupational History  . Not on file  Social Needs  . Financial resource strain: Not on file  . Food insecurity    Worry: Not on file    Inability: Not on file  . Transportation needs    Medical: Not on file    Non-medical: Not on file  Tobacco Use  . Smoking status: Never Smoker  . Smokeless tobacco: Never Used  Substance and Sexual Activity  . Alcohol use: No  . Drug use: No  . Sexual activity: Yes    Birth control/protection: None  Lifestyle  . Physical activity    Days per week: Not on file    Minutes per session: Not on file  . Stress: Not on file  Relationships  . Social Herbalist on phone: Not on file    Gets together: Not on file    Attends religious service: Not on file    Active member of club or organization: Not on file    Attends meetings of clubs or organizations: Not on file    Relationship status: Not on file  . Intimate partner violence    Fear of current or ex partner: Not on file    Emotionally abused: Not on file    Physically  abused: Not on file    Forced sexual activity: Not on file  Other Topics Concern  . Not on file  Social History Narrative  . Not on file    Outpatient Medications Prior to Visit  Medication Sig Dispense Refill  . predniSONE (DELTASONE) 50 MG tablet One tab PO daily for 5 days. 5 tablet 0   No facility-administered medications prior to visit.     Allergies  Allergen Reactions  . Bee Venom     ROS Review of Systems    Objective:    Physical Exam  Temp 98.2 F (36.8 C) (Oral)  Wt Readings from Last 3 Encounters:  03/01/19 189 lb (85.7 kg)  12/09/18 186 lb (84.4 kg)  12/03/18 187 lb (84.8 kg)     There are no preventive care reminders to display for this patient.  There are no preventive care reminders to display for this patient.  No results found for: TSH Lab Results  Component Value Date   WBC 6.0 12/09/2016   HGB 14.5 12/09/2016   HCT 42.1 12/09/2016   MCV 84.0 12/09/2016   PLT 218 12/09/2016  Lab Results  Component Value Date   NA 140 12/09/2016   K 4.1 12/09/2016   CO2 27 12/09/2016   GLUCOSE 88 12/09/2016   BUN 15 12/09/2016   CREATININE 1.07 12/09/2016   BILITOT 0.9 12/09/2016   ALKPHOS 68 12/09/2016   AST 18 12/09/2016   ALT 36 12/09/2016   PROT 7.2 12/09/2016   ALBUMIN 4.2 12/09/2016   CALCIUM 9.0 12/09/2016   Lab Results  Component Value Date   CHOL 190 12/09/2016   Lab Results  Component Value Date   HDL 52 12/09/2016   No results found for: [facility] Lab Results  Component Value Date   TRIG 106 12/09/2016   Lab Results  Component Value Date   CHOLHDL 3.7 12/09/2016   Lab Results  Component Value Date   HGBA1C 5.0 12/09/2016      Assessment & Plan:  HPV - Patient tolerated injection well without complications.   Problem List Items Addressed This Visit    None    Visit Diagnoses    Need for HPV vaccination    -  Primary   Relevant Orders   HPV 9-valent vaccine,Recombinat (Completed)      No orders of the  defined types were placed in this encounter.   Follow-up: No follow-ups on file.    Esmond Harpsuttle, Auriella Wieand Hale, CMA

## 2019-07-30 ENCOUNTER — Other Ambulatory Visit: Payer: Self-pay

## 2019-07-30 ENCOUNTER — Ambulatory Visit (INDEPENDENT_AMBULATORY_CARE_PROVIDER_SITE_OTHER): Payer: 59 | Admitting: Sports Medicine

## 2019-07-30 VITALS — BP 145/77 | HR 73 | Temp 97.7°F

## 2019-07-30 DIAGNOSIS — Z23 Encounter for immunization: Secondary | ICD-10-CM | POA: Diagnosis not present

## 2019-07-30 NOTE — Progress Notes (Signed)
Patient is here for a flu vaccine. Verified no previous allergy to flu vaccine, eggs, or latex. Flu injection to left deltoid with no apparent complications. Patient advised to call with any problems.   

## 2019-12-03 ENCOUNTER — Ambulatory Visit (INDEPENDENT_AMBULATORY_CARE_PROVIDER_SITE_OTHER): Payer: 59 | Admitting: Family Medicine

## 2019-12-03 ENCOUNTER — Encounter: Payer: Self-pay | Admitting: Family Medicine

## 2019-12-03 ENCOUNTER — Other Ambulatory Visit: Payer: Self-pay

## 2019-12-03 VITALS — BP 136/77 | HR 80 | Temp 97.8°F | Ht 74.0 in | Wt 198.0 lb

## 2019-12-03 DIAGNOSIS — N489 Disorder of penis, unspecified: Secondary | ICD-10-CM

## 2019-12-03 DIAGNOSIS — Z113 Encounter for screening for infections with a predominantly sexual mode of transmission: Secondary | ICD-10-CM | POA: Diagnosis not present

## 2019-12-03 NOTE — Patient Instructions (Signed)
What are Pearly Penile Papules?  Pearly Penile Papules (or hirsutoid papillomas) are small, smooth, pinkish-white or skin-colored bumps that form in rows around the base of the head of the penis. These raised, pimple-like papules are benign.    What are Ruffin Frederick Spots?  Similarly, Fordyce Spots are whitish-yellow, reddish, or skin-colored bumps that can appear on the shaft of the penis as well as the scrotum, labia, edge of the mouth, vermilion border of the lips, and inside the cheeks.  The majority of these spots are due to dilated sweat glands and may recur after treatment..  Both conditions can cause distress and are oftentimes misdiagnosed as genital warts or other sexually transmitted diseases; in fact, Pearly Penile Papules and Fordyce Spots are natural and common genetic conditions of sweat glands.

## 2019-12-03 NOTE — Progress Notes (Signed)
Fred Elliott - 39 y.o. male MRN [id number]  Date of birth: Apr 02, 1981  Subjective Chief Complaint  Patient presents with  . Exposure to STD    HPI Fred Elliott is a 39 y.o. male with history of genital wart here today with request of updated STD screening.  Reports having screening annually.  He denies symptoms at this time including dysuria, testicular pain, or penile discharge.  He is in a monogamous relationship with male partner.  He has a couple of additional lesions on the penis he is concerned about.  He denies pain associated with these but is concerned they may be warts.    ROS:  A comprehensive ROS was completed and negative except as noted per HPI  Allergies  Allergen Reactions  . Bee Venom     Past Medical History:  Diagnosis Date  . Seizures ([place])    4th grade, last seizure around age 54    Past Surgical History:  Procedure Laterality Date  . NASAL SINUS SURGERY      Social History   Socioeconomic History  . Marital status: Single    Spouse name: Not on file  . Number of children: Not on file  . Years of education: Not on file  . Highest education level: Not on file  Occupational History  . Not on file  Tobacco Use  . Smoking status: Never Smoker  . Smokeless tobacco: Never Used  Substance and Sexual Activity  . Alcohol use: No  . Drug use: No  . Sexual activity: Yes    Birth control/protection: None  Other Topics Concern  . Not on file  Social History Narrative  . Not on file   Social Determinants of Health   Financial Resource Strain:   . Difficulty of Paying Living Expenses: Not on file  Food Insecurity:   . Worried About Charity fundraiser in the Last Year: Not on file  . Ran Out of Food in the Last Year: Not on file  Transportation Needs:   . Lack of Transportation (Medical): Not on file  . Lack of Transportation (Non-Medical): Not on file  Physical Activity:   . Days of Exercise per Week: Not on file  . Minutes of Exercise per  Session: Not on file  Stress:   . Feeling of Stress : Not on file  Social Connections:   . Frequency of Communication with Friends and Family: Not on file  . Frequency of Social Gatherings with Friends and Family: Not on file  . Attends Religious Services: Not on file  . Active Member of Clubs or Organizations: Not on file  . Attends Archivist Meetings: Not on file  . Marital Status: Not on file    Family History  Problem Relation Age of Onset  . Crohn's disease Maternal Grandmother   . Pancreatic cancer Maternal Grandmother   . Colon cancer Maternal Grandfather   . Alcohol abuse Paternal Grandfather   . Lung cancer Paternal Grandfather   . Colon cancer Paternal Grandfather     Health Maintenance  Topic Date Due  . TETANUS/TDAP  12/09/2026  . INFLUENZA VACCINE  Completed  . HIV Screening  Completed    ----------------------------------------------------------------------------------------------------------------------------------------------------------------------------------------------------------------- Physical Exam BP 136/77   Pulse 80   Temp 97.8 F (36.6 C) (Oral)   Ht 6\' 2"  (1.88 m)   Wt 198 lb (89.8 kg)   BMI 25.42 kg/m   Physical Exam Constitutional:      Appearance: Normal appearance.  Cardiovascular:  Rate and Rhythm: Normal rate and regular rhythm.  Genitourinary:    Penis: Normal.      Testes: Normal.     Comments: Slightly raised papules along base of penis and on shaft.  These do not appear verrucous. Neurological:     Mental Status: He is alert.     ------------------------------------------------------------------------------------------------------------------------------------------------------------------------------------------------------------------- Assessment and Plan  Penile lesion He has history of condyloma however areas he is concerned about today look more consistent with fordyce spots.  Discussed recommendations  for monitoring if changing in size we can treat with cryotherapy.    Screening for STD (sexually transmitted disease) Urine GC/Chlamydia, RPR and HIV ordered.     This visit occurred during the SARS-CoV-2 public health emergency.  Safety protocols were in place, including screening questions prior to the visit, additional usage of staff PPE, and extensive cleaning of exam room while observing appropriate contact time as indicated for disinfecting solutions.

## 2019-12-03 NOTE — Assessment & Plan Note (Signed)
Urine GC/Chlamydia, RPR and HIV ordered.

## 2019-12-03 NOTE — Assessment & Plan Note (Signed)
He has history of condyloma however areas he is concerned about today look more consistent with fordyce spots.  Discussed recommendations for monitoring if changing in size we can treat with cryotherapy.

## 2019-12-07 LAB — C. TRACHOMATIS/N. GONORRHOEAE RNA
C. trachomatis RNA, TMA: NOT DETECTED
N. gonorrhoeae RNA, TMA: NOT DETECTED

## 2019-12-07 LAB — HIV ANTIBODY (ROUTINE TESTING W REFLEX): HIV 1&2 Ab, 4th Generation: NONREACTIVE

## 2019-12-07 LAB — SYPHILIS: RPR W/REFLEX TO RPR TITER AND TREPONEMAL ANTIBODIES, TRADITIONAL SCREENING AND DIAGNOSIS ALGORITHM: RPR Ser Ql: NONREACTIVE

## 2019-12-10 ENCOUNTER — Encounter: Payer: Self-pay | Admitting: Family Medicine

## 2019-12-21 ENCOUNTER — Other Ambulatory Visit: Payer: Self-pay

## 2019-12-21 ENCOUNTER — Encounter: Payer: Self-pay | Admitting: Family Medicine

## 2019-12-21 ENCOUNTER — Ambulatory Visit (INDEPENDENT_AMBULATORY_CARE_PROVIDER_SITE_OTHER): Payer: 59 | Admitting: Family Medicine

## 2019-12-21 DIAGNOSIS — N489 Disorder of penis, unspecified: Secondary | ICD-10-CM

## 2019-12-21 NOTE — Progress Notes (Signed)
Fred Elliott - 39 y.o. male MRN [id number]  Date of birth: 1981-09-15  Subjective No chief complaint on file.   HPI Fred Elliott is a 39 y.o. male with history of genital wart here today to have additional lesions treated.  Previous evaluation with areas consistent with fordyce spots and possible additional small condyloma.  He denies pain or irritation from these area.   ROS:  A comprehensive ROS was completed and negative except as noted per HPI  Allergies  Allergen Reactions  . Bee Venom     Past Medical History:  Diagnosis Date  . Seizures ([place])    4th grade, last seizure around age 27    Past Surgical History:  Procedure Laterality Date  . NASAL SINUS SURGERY      Social History   Socioeconomic History  . Marital status: Single    Spouse name: Not on file  . Number of children: Not on file  . Years of education: Not on file  . Highest education level: Not on file  Occupational History  . Not on file  Tobacco Use  . Smoking status: Never Smoker  . Smokeless tobacco: Never Used  Substance and Sexual Activity  . Alcohol use: No  . Drug use: No  . Sexual activity: Yes    Birth control/protection: None  Other Topics Concern  . Not on file  Social History Narrative  . Not on file   Social Determinants of Health   Financial Resource Strain:   . Difficulty of Paying Living Expenses: Not on file  Food Insecurity:   . Worried About Charity fundraiser in the Last Year: Not on file  . Ran Out of Food in the Last Year: Not on file  Transportation Needs:   . Lack of Transportation (Medical): Not on file  . Lack of Transportation (Non-Medical): Not on file  Physical Activity:   . Days of Exercise per Week: Not on file  . Minutes of Exercise per Session: Not on file  Stress:   . Feeling of Stress : Not on file  Social Connections:   . Frequency of Communication with Friends and Family: Not on file  . Frequency of Social Gatherings with Friends and Family: Not on  file  . Attends Religious Services: Not on file  . Active Member of Clubs or Organizations: Not on file  . Attends Archivist Meetings: Not on file  . Marital Status: Not on file    Family History  Problem Relation Age of Onset  . Crohn's disease Maternal Grandmother   . Pancreatic cancer Maternal Grandmother   . Colon cancer Maternal Grandfather   . Alcohol abuse Paternal Grandfather   . Lung cancer Paternal Grandfather   . Colon cancer Paternal Grandfather     Health Maintenance  Topic Date Due  . TETANUS/TDAP  12/09/2026  . INFLUENZA VACCINE  Completed  . HIV Screening  Completed     ----------------------------------------------------------------------------------------------------------------------------------------------------------------------------------------------------------------- Physical Exam BP 125/76   Pulse 66   Ht 6\' 2"  (1.88 m)   Wt 192 lb (87.1 kg)   BMI 24.65 kg/m   Physical Exam Constitutional:      Appearance: Normal appearance.  Genitourinary:    Comments: Slightly raised papules along base of penis and on shaft.  These do not appear verrucous.  There is one small area on shaft that does have somewhat verrucous appearance.    Skin:    General: Skin is warm and dry.  Neurological:  General: No focal deficit present.     Mental Status: He is alert.  Psychiatric:        Mood and Affect: Mood normal.        Behavior: Behavior normal.   Procedure note:  Procedure reviewed with patient including potential complications.  He wishes to proceed with procedure.  Verbal consent obtained.  3 lesions at base and on shaft of penis treated with liquid nitrogen.  13mm frost ring achieved with each treatment.  Treated with freeze/thaw cycle x2.  He tolerated well.      ------------------------------------------------------------------------------------------------------------------------------------------------------------------------------------------------------------------- Assessment and Plan  Penile lesion Bothersome lesions treated with cryosurgery today.  See procedure note.  Post procedure instructions given.    No orders of the defined types were placed in this encounter.   No follow-ups on file.    This visit occurred during the SARS-CoV-2 public health emergency.  Safety protocols were in place, including screening questions prior to the visit, additional usage of staff PPE, and extensive cleaning of exam room while observing appropriate contact time as indicated for disinfecting solutions.

## 2019-12-21 NOTE — Patient Instructions (Signed)
Cryosurgery for Skin Conditions, Care After These instructions give you information on caring for yourself after your procedure. Your doctor may also give you more specific instructions. Call your doctor if you have any problems or questions after your procedure. Follow these instructions at home: Caring for the treated area   Follow instructions from your doctor about how to take care of the treated area. Make sure you: ? Keep the area covered with a bandage (dressing) until it heals, or for as long as told by your doctor. ? Wash your hands with soap and water before you change your bandage. If you do not have soap and water, use hand sanitizer. ? Change your bandage as told by your doctor. ? Keep the bandage and the treated area clean and dry. If the bandage gets wet, change it right away. ? Clean the treated area with soap and water.  Check the treated area every day for signs of infection. Check for: ? More redness, swelling, or pain. ? More fluid or blood. ? Warmth. ? Pus or a bad smell. General instructions  Do not pick at your blister. Do not try to break it open. This can cause infection and scarring.  Do not put any medicine, cream, or lotion on the treated area unless told by your doctor.  Take over-the-counter and prescription medicines only as told by your doctor.  Keep all follow-up visits as told by your doctor. This is important. Contact a doctor if:  You have more redness, swelling, or pain around the treated area.  You have more fluid or blood coming from the treated area.  The treated area feels warm to the touch.  You have pus or a bad smell coming from the treated area.  Your blister gets large and painful. Get help right away if:  You have a fever and have redness spreading from the treated area. Summary  You should keep the treated area and your bandage clean and dry.  Check the treated area every day for signs of infection. Signs include fluid, pus,  warmth, or having more redness, swelling, or pain.  Do not pick at your blister. Do not try to break it open. This information is not intended to replace advice given to you by your health care provider. Make sure you discuss any questions you have with your health care provider. Document Revised: 09/19/2017 Document Reviewed: 08/26/2016 Elsevier Patient Education  2020 [employer]  

## 2019-12-21 NOTE — Assessment & Plan Note (Signed)
Bothersome lesions treated with cryosurgery today.  See procedure note.  Post procedure instructions given.

## 2020-02-18 ENCOUNTER — Encounter: Payer: Self-pay | Admitting: Family Medicine

## 2020-02-18 ENCOUNTER — Ambulatory Visit (INDEPENDENT_AMBULATORY_CARE_PROVIDER_SITE_OTHER): Payer: 59 | Admitting: Family Medicine

## 2020-02-18 VITALS — BP 123/69 | HR 62 | Ht 74.02 in | Wt 186.1 lb

## 2020-02-18 DIAGNOSIS — E785 Hyperlipidemia, unspecified: Secondary | ICD-10-CM | POA: Diagnosis not present

## 2020-02-18 DIAGNOSIS — Z113 Encounter for screening for infections with a predominantly sexual mode of transmission: Secondary | ICD-10-CM

## 2020-02-18 DIAGNOSIS — Z0000 Encounter for general adult medical examination without abnormal findings: Secondary | ICD-10-CM | POA: Insufficient documentation

## 2020-02-18 DIAGNOSIS — R6882 Decreased libido: Secondary | ICD-10-CM | POA: Diagnosis not present

## 2020-02-18 NOTE — Assessment & Plan Note (Signed)
Well adult Orders Placed This Encounter  Procedures  . Chlamydia/Neisseria Gonorrhoeae RNA,TMA,Urogenital  . COMPLETE METABOLIC PANEL WITH GFR  . CBC  . TSH  . Lipid Profile  . Testosterone  . FSH/LH  . HIV antibody (with reflex)  . RPR  Checking labs for decreased libido.  Immunizations: UTD Screening: STD screening, update lipid panel.  Anticipatory guidance:  Continue healthy lifestyle.  Additional recommendations per AVS

## 2020-02-18 NOTE — Progress Notes (Signed)
Fred Elliott - 39 y.o. male MRN [id number]  Date of birth: [birth year]  Subjective Chief Complaint  Patient presents with  . Annual Exam    HPI Fred Elliott is a 39 y.o. male with history of elevated cholesterol here today for annual exam.  He has concerns of decreased libido and some difficulty with erections.  He would like to have hormone levels checked to see if this is contributing to his difficulties.  He feels like he stays very active.  He denies increased stress or depressive symptoms.   He would also like to have STD screening.  He denies symptoms at this time.   He is a non-smoker.  He denies EtOH use.    He exercises regularly and follows a healthy diet.   Review of Systems  Constitutional: Negative for chills, fever, malaise/fatigue and weight loss.  HENT: Negative for congestion, ear pain and sore throat.   Eyes: Negative for blurred vision, double vision and pain.  Respiratory: Negative for cough and shortness of breath.   Cardiovascular: Negative for chest pain and palpitations.  Gastrointestinal: Negative for abdominal pain, blood in stool, constipation, heartburn and nausea.  Genitourinary: Negative for dysuria and urgency.  Musculoskeletal: Negative for joint pain and myalgias.  Neurological: Negative for dizziness and headaches.  Endo/Heme/Allergies: Does not bruise/bleed easily.  Psychiatric/Behavioral: Negative for depression. The patient is not nervous/anxious and does not have insomnia.     Allergies  Allergen Reactions  . Bee Venom     Past Medical History:  Diagnosis Date  . Seizures ([place])    4th grade, last seizure around age 9    Past Surgical History:  Procedure Laterality Date  . NASAL SINUS SURGERY      Social History   Socioeconomic History  . Marital status: Single    Spouse name: Not on file  . Number of children: Not on file  . Years of education: Not on file  . Highest education level: Not on file  Occupational History  . Not on  file  Tobacco Use  . Smoking status: Never Smoker  . Smokeless tobacco: Never Used  Substance and Sexual Activity  . Alcohol use: No  . Drug use: No  . Sexual activity: Yes    Birth control/protection: None  Other Topics Concern  . Not on file  Social History Narrative  . Not on file   Social Determinants of Health   Financial Resource Strain:   . Difficulty of Paying Living Expenses:   Food Insecurity:   . Worried About Charity fundraiser in the Last Year:   . Arboriculturist in the Last Year:   Transportation Needs:   . Film/video editor (Medical):   Marland Kitchen Lack of Transportation (Non-Medical):   Physical Activity:   . Days of Exercise per Week:   . Minutes of Exercise per Session:   Stress:   . Feeling of Stress :   Social Connections:   . Frequency of Communication with Friends and Family:   . Frequency of Social Gatherings with Friends and Family:   . Attends Religious Services:   . Active Member of Clubs or Organizations:   . Attends Archivist Meetings:   Marland Kitchen Marital Status:     Family History  Problem Relation Age of Onset  . Crohn's disease Maternal Grandmother   . Pancreatic cancer Maternal Grandmother   . Colon cancer Maternal Grandfather   . Alcohol abuse Paternal Grandfather   . Lung cancer  Paternal Grandfather   . Colon cancer Paternal Grandfather     Health Maintenance  Topic Date Due  . INFLUENZA VACCINE  05/21/2020  . TETANUS/TDAP  12/09/2026  . HIV Screening  Completed     ----------------------------------------------------------------------------------------------------------------------------------------------------------------------------------------------------------------- Physical Exam BP 123/69 (BP Location: Left Arm, Patient Position: Sitting, Cuff Size: Normal)   Pulse 62   Ht 6' 2.02" (1.88 m)   Wt 186 lb 1.6 oz (84.4 kg)   BMI 23.88 kg/m   Physical Exam Constitutional:      General: He is not in acute  distress. HENT:     Head: Normocephalic and atraumatic.     Right Ear: External ear normal.     Left Ear: External ear normal.     Mouth/Throat:     Mouth: Mucous membranes are moist.  Eyes:     General: No scleral icterus. Neck:     Thyroid: No thyromegaly.  Cardiovascular:     Rate and Rhythm: Normal rate and regular rhythm.     Heart sounds: Normal heart sounds.  Pulmonary:     Effort: Pulmonary effort is normal.     Breath sounds: Normal breath sounds.  Abdominal:     General: Bowel sounds are normal. There is no distension.     Palpations: Abdomen is soft.     Tenderness: There is no abdominal tenderness. There is no guarding.  Musculoskeletal:     Cervical back: Normal range of motion.  Lymphadenopathy:     Cervical: No cervical adenopathy.  Skin:    General: Skin is warm and dry.     Findings: No rash.  Neurological:     Mental Status: He is alert and oriented to person, place, and time.     Cranial Nerves: No cranial nerve deficit.     Motor: No abnormal muscle tone.  Psychiatric:        Mood and Affect: Mood normal.        Behavior: Behavior normal.     ------------------------------------------------------------------------------------------------------------------------------------------------------------------------------------------------------------------- Assessment and Plan  Well adult exam Well adult Orders Placed This Encounter  Procedures  . Chlamydia/Neisseria Gonorrhoeae RNA,TMA,Urogenital  . COMPLETE METABOLIC PANEL WITH GFR  . CBC  . TSH  . Lipid Profile  . Testosterone  . FSH/LH  . HIV antibody (with reflex)  . RPR  Checking labs for decreased libido.  Immunizations: UTD Screening: STD screening, update lipid panel.  Anticipatory guidance:  Continue healthy lifestyle.  Additional recommendations per AVS   No orders of the defined types were placed in this encounter.   No follow-ups on file.    This visit occurred during the  SARS-CoV-2 public health emergency.  Safety protocols were in place, including screening questions prior to the visit, additional usage of staff PPE, and extensive cleaning of exam room while observing appropriate contact time as indicated for disinfecting solutions.

## 2020-02-18 NOTE — Patient Instructions (Signed)
Preventive Care 19-38 Years Old, Male Preventive care refers to lifestyle choices and visits with your health care provider that can promote health and wellness. This includes:  A yearly physical exam. This is also called an annual well check.  Regular dental and eye exams.  Immunizations.  Screening for certain conditions.  Healthy lifestyle choices, such as eating a healthy diet, getting regular exercise, not using drugs or products that contain nicotine and tobacco, and limiting alcohol use. What can I expect for my preventive care visit? Physical exam Your health care provider will check:  Height and weight. These may be used to calculate body mass index (BMI), which is a measurement that tells if you are at a healthy weight.  Heart rate and blood pressure.  Your skin for abnormal spots. Counseling Your health care provider may ask you questions about:  Alcohol, tobacco, and drug use.  Emotional well-being.  Home and relationship well-being.  Sexual activity.  Eating habits.  Work and work Statistician. What immunizations do I need?  Influenza (flu) vaccine  This is recommended every year. Tetanus, diphtheria, and pertussis (Tdap) vaccine  You may need a Td booster every 10 years. Varicella (chickenpox) vaccine  You may need this vaccine if you have not already been vaccinated. Human papillomavirus (HPV) vaccine  If recommended by your health care provider, you may need three doses over 6 months. Measles, mumps, and rubella (MMR) vaccine  You may need at least one dose of MMR. You may also need a second dose. Meningococcal conjugate (MenACWY) vaccine  One dose is recommended if you are 45-76 years old and a Market researcher living in a residence hall, or if you have one of several medical conditions. You may also need additional booster doses. Pneumococcal conjugate (PCV13) vaccine  You may need this if you have certain conditions and were not  previously vaccinated. Pneumococcal polysaccharide (PPSV23) vaccine  You may need one or two doses if you smoke cigarettes or if you have certain conditions. Hepatitis A vaccine  You may need this if you have certain conditions or if you travel or work in places where you may be exposed to hepatitis A. Hepatitis B vaccine  You may need this if you have certain conditions or if you travel or work in places where you may be exposed to hepatitis B. Haemophilus influenzae type b (Hib) vaccine  You may need this if you have certain risk factors. You may receive vaccines as individual doses or as more than one vaccine together in one shot (combination vaccines). Talk with your health care provider about the risks and benefits of combination vaccines. What tests do I need? Blood tests  Lipid and cholesterol levels. These may be checked every 5 years starting at age 17.  Hepatitis C test.  Hepatitis B test. Screening   Diabetes screening. This is done by checking your blood sugar (glucose) after you have not eaten for a while (fasting).  Sexually transmitted disease (STD) testing. Talk with your health care provider about your test results, treatment options, and if necessary, the need for more tests. Follow these instructions at home: Eating and drinking   Eat a diet that includes fresh fruits and vegetables, whole grains, lean protein, and low-fat dairy products.  Take vitamin and mineral supplements as recommended by your health care provider.  Do not drink alcohol if your health care provider tells you not to drink.  If you drink alcohol: ? Limit how much you have to 0-2  drinks a day. ? Be aware of how much alcohol is in your drink. In the [place] one drink equals one 12 oz bottle of beer (355 mL), one 5 oz glass of wine (148 mL), or one 1 oz glass of hard liquor (44 mL). Lifestyle  Take daily care of your teeth and gums.  Stay active. Exercise for at least 30 minutes on 5 or  more days each week.  Do not use any products that contain nicotine or tobacco, such as cigarettes, e-cigarettes, and chewing tobacco. If you need help quitting, ask your health care provider.  If you are sexually active, practice safe sex. Use a condom or other form of protection to prevent STIs (sexually transmitted infections). What's next?  Go to your health care provider once a year for a well check visit.  Ask your health care provider how often you should have your eyes and teeth checked.  Stay up to date on all vaccines. This information is not intended to replace advice given to you by your health care provider. Make sure you discuss any questions you have with your health care provider. Document Revised: 10/01/2018 Document Reviewed: 10/01/2018 Elsevier Patient Education  2020 [employer].

## 2020-02-21 ENCOUNTER — Encounter: Payer: Self-pay | Admitting: Family Medicine

## 2020-02-22 LAB — CBC
HCT: 40.9 % (ref 38.5–50.0)
Hemoglobin: 13.9 g/dL (ref 13.2–17.1)
MCH: 29.4 pg (ref 27.0–33.0)
MCHC: 34 g/dL (ref 32.0–36.0)
MCV: 86.5 fL (ref 80.0–100.0)
MPV: 10.2 fL (ref 7.5–12.5)
Platelets: 252 10*3/uL (ref 140–400)
RBC: 4.73 10*6/uL (ref 4.20–5.80)
RDW: 12.2 % (ref 11.0–15.0)
WBC: 5.7 10*3/uL (ref 3.8–10.8)

## 2020-02-22 LAB — TESTOSTERONE: Testosterone: 736 ng/dL (ref 250–827)

## 2020-02-22 LAB — COMPLETE METABOLIC PANEL WITH GFR
AG Ratio: 1.7 (calc) (ref 1.0–2.5)
ALT: 11 U/L (ref 9–46)
AST: 13 U/L (ref 10–40)
Albumin: 4.5 g/dL (ref 3.6–5.1)
Alkaline phosphatase (APISO): 62 U/L (ref 36–130)
BUN: 19 mg/dL (ref 7–25)
CO2: 27 mmol/L (ref 20–32)
Calcium: 9.8 mg/dL (ref 8.6–10.3)
Chloride: 102 mmol/L (ref 98–110)
Creat: 1.03 mg/dL (ref 0.60–1.35)
GFR, Est African American: 106 mL/min/{1.73_m2} (ref >=60)
GFR, Est Non African American: 92 mL/min/{1.73_m2} (ref >=60)
Globulin: 2.6 g/dL (calc) (ref 1.9–3.7)
Glucose, Bld: 93 mg/dL (ref 65–99)
Potassium: 4.4 mmol/L (ref 3.5–5.3)
Sodium: 140 mmol/L (ref 135–146)
Total Bilirubin: 0.7 mg/dL (ref 0.2–1.2)
Total Protein: 7.1 g/dL (ref 6.1–8.1)

## 2020-02-22 LAB — LIPID PANEL
Cholesterol: 152 mg/dL (ref <200)
HDL: 49 mg/dL (ref >=40)
LDL Cholesterol (Calc): 84 mg/dL (calc)
Non-HDL Cholesterol (Calc): 103 mg/dL (calc) (ref <130)
Total CHOL/HDL Ratio: 3.1 (calc) (ref <5.0)
Triglycerides: 94 mg/dL (ref <150)

## 2020-02-22 LAB — FSH/LH
FSH: 3.6 m[IU]/mL (ref 1.6–8.0)
LH: 3.1 m[IU]/mL (ref 1.5–9.3)

## 2020-02-22 LAB — TSH: TSH: 0.69 mIU/L (ref 0.40–4.50)

## 2020-02-22 LAB — RPR: RPR Ser Ql: NONREACTIVE

## 2020-02-22 LAB — CHLAMYDIA/NEISSERIA GONORRHOEAE RNA,TMA,UROGENTIAL
C. trachomatis RNA, TMA: NOT DETECTED
N. gonorrhoeae RNA, TMA: NOT DETECTED

## 2020-02-22 LAB — HIV ANTIBODY (ROUTINE TESTING W REFLEX): HIV 1&2 Ab, 4th Generation: NONREACTIVE

## 2020-03-24 ENCOUNTER — Encounter: Payer: Self-pay | Admitting: Sports Medicine

## 2020-03-24 ENCOUNTER — Ambulatory Visit (INDEPENDENT_AMBULATORY_CARE_PROVIDER_SITE_OTHER): Payer: 59 | Admitting: Sports Medicine

## 2020-03-24 DIAGNOSIS — S46212A Strain of muscle, fascia and tendon of other parts of biceps, left arm, initial encounter: Secondary | ICD-10-CM

## 2020-03-24 MED ORDER — MELOXICAM 15 MG PO TABS: ORAL_TABLET | ORAL | 3 refills | Status: DC

## 2020-03-24 NOTE — Progress Notes (Signed)
    Procedures performed today:    None.  Independent interpretation of notes and tests performed by another provider:   None.  Brief History, Exam, Impression, and Recommendations:    Left distal biceps tendinopathy For the past 2 to 3 weeks this pleasant 38 year old male has had pain in his left elbow, worse with supination and flexion. He did have to move a bunch of desks, and has been working out as well. On exam he has a positive Yergason test with pain referable to the distal biceps. Good strength overall. Adding meloxicam, home rehab, elbow sleeve, return to see me in 2 weeks, we will probably do an injection if no better at that time.    ___________________________________________ Fred Elliott. Benjamin Stain, M.D., ABFM., CAQSM. Primary Care and Sports Medicine [facility]  Adjunct Instructor of Family Medicine  [employer]

## 2020-03-24 NOTE — Assessment & Plan Note (Signed)
For the past 2 to 3 weeks this pleasant 39 year old male has had pain in his left elbow, worse with supination and flexion. He did have to move a bunch of desks, and has been working out as well. On exam he has a positive Yergason test with pain referable to the distal biceps. Good strength overall. Adding meloxicam, home rehab, elbow sleeve, return to see me in 2 weeks, we will probably do an injection if no better at that time.

## 2020-03-27 NOTE — Telephone Encounter (Signed)
Sent to Dr Ashley Royalty in error, sending to Dr T

## 2020-03-28 ENCOUNTER — Ambulatory Visit: Payer: 59 | Admitting: Sports Medicine

## 2020-04-28 ENCOUNTER — Ambulatory Visit: Payer: 59 | Admitting: Sports Medicine

## 2020-05-03 ENCOUNTER — Ambulatory Visit (INDEPENDENT_AMBULATORY_CARE_PROVIDER_SITE_OTHER): Payer: 59 | Admitting: Sports Medicine

## 2020-05-03 DIAGNOSIS — S46212D Strain of muscle, fascia and tendon of other parts of biceps, left arm, subsequent encounter: Secondary | ICD-10-CM

## 2020-05-03 NOTE — Progress Notes (Signed)
    Procedures performed today:    None.  Independent interpretation of notes and tests performed by another provider:   None.  Brief History, Exam, Impression, and Recommendations:    Left distal biceps tendinopathy Nael returns, he has chronic left elbow pain, worse with supination and flexion, consistent with distal biceps tendinitis. He has had slight improvements with home rehab exercises, I think he overdid it again with some dumbbells and has had a recurrence of pain. We are going to shut him down from upper extremity working out for the next 2 weeks, if he is not doing a lot better he will message me and we can go ahead and order the MRI.     ___________________________________________ [name]. Benjamin Stain, M.D., ABFM., CAQSM. Primary Care and Sports Medicine [facility]  Adjunct Instructor of Family Medicine  [employer]

## 2020-05-03 NOTE — Assessment & Plan Note (Signed)
Fred Elliott returns, he has chronic left elbow pain, worse with supination and flexion, consistent with distal biceps tendinitis. He has had slight improvements with home rehab exercises, I think he overdid it again with some dumbbells and has had a recurrence of pain. We are going to shut him down from upper extremity working out for the next 2 weeks, if he is not doing a lot better he will message me and we can go ahead and order the MRI.

## 2020-05-28 DIAGNOSIS — S86019A Strain of unspecified Achilles tendon, initial encounter: Secondary | ICD-10-CM

## 2020-05-29 DIAGNOSIS — S86019A Strain of unspecified Achilles tendon, initial encounter: Secondary | ICD-10-CM | POA: Insufficient documentation

## 2020-08-11 ENCOUNTER — Encounter: Payer: Self-pay | Admitting: Family Medicine

## 2020-08-11 ENCOUNTER — Other Ambulatory Visit: Payer: Self-pay

## 2020-08-11 ENCOUNTER — Ambulatory Visit (INDEPENDENT_AMBULATORY_CARE_PROVIDER_SITE_OTHER): Payer: 59 | Admitting: Family Medicine

## 2020-08-11 VITALS — BP 123/75 | HR 61 | Temp 97.9°F | Ht 75.0 in | Wt 187.2 lb

## 2020-08-11 DIAGNOSIS — Z23 Encounter for immunization: Secondary | ICD-10-CM | POA: Diagnosis not present

## 2020-08-11 DIAGNOSIS — Z113 Encounter for screening for infections with a predominantly sexual mode of transmission: Secondary | ICD-10-CM | POA: Diagnosis not present

## 2020-08-11 DIAGNOSIS — S86019A Strain of unspecified Achilles tendon, initial encounter: Secondary | ICD-10-CM | POA: Diagnosis not present

## 2020-08-13 ENCOUNTER — Encounter: Payer: Self-pay | Admitting: Family Medicine

## 2020-08-13 NOTE — Progress Notes (Signed)
Fred Elliott - 39 y.o. male MRN [id number]  Date of birth: October 01, 1981  Subjective Chief Complaint  Patient presents with  . std testing  . Immunizations    HPI Fred Elliott is a 39 y.o. male here today for STD testing.  He would like to have routine screening for STD's.  He denies any known exposure or symptoms.  No dysuria, penile discharge, testicular pain or fever.   He also had achilles tear since his last visit with me.  This has been surgically repaired, currently doing PT for rehab.   ROS:  A comprehensive ROS was completed and negative except as noted per HPI  Allergies  Allergen Reactions  . Bee Venom     Past Medical History:  Diagnosis Date  . Seizures (HCC)    4th grade, last seizure around age 35    Past Surgical History:  Procedure Laterality Date  . NASAL SINUS SURGERY      Social History   Socioeconomic History  . Marital status: Single    Spouse name: Not on file  . Number of children: Not on file  . Years of education: Not on file  . Highest education level: Not on file  Occupational History  . Not on file  Tobacco Use  . Smoking status: Never Smoker  . Smokeless tobacco: Never Used  Substance and Sexual Activity  . Alcohol use: No  . Drug use: No  . Sexual activity: Yes    Birth control/protection: None  Other Topics Concern  . Not on file  Social History Narrative  . Not on file   Social Determinants of Health   Financial Resource Strain:   . Difficulty of Paying Living Expenses: Not on file  Food Insecurity:   . Worried About Programme researcher, broadcasting/film/video in the Last Year: Not on file  . Ran Out of Food in the Last Year: Not on file  Transportation Needs:   . Lack of Transportation (Medical): Not on file  . Lack of Transportation (Non-Medical): Not on file  Physical Activity:   . Days of Exercise per Week: Not on file  . Minutes of Exercise per Session: Not on file  Stress:   . Feeling of Stress : Not on file  Social Connections:   .  Frequency of Communication with Friends and Family: Not on file  . Frequency of Social Gatherings with Friends and Family: Not on file  . Attends Religious Services: Not on file  . Active Member of Clubs or Organizations: Not on file  . Attends Banker Meetings: Not on file  . Marital Status: Not on file    Family History  Problem Relation Age of Onset  . Crohn's disease Maternal Grandmother   . Pancreatic cancer Maternal Grandmother   . Colon cancer Maternal Grandfather   . Alcohol abuse Paternal Grandfather   . Lung cancer Paternal Grandfather   . Colon cancer Paternal Grandfather     Health Maintenance  Topic Date Due  . TETANUS/TDAP  12/09/2026  . INFLUENZA VACCINE  Completed  . COVID-19 Vaccine  Completed  . Hepatitis C Screening  Completed  . HIV Screening  Completed     ----------------------------------------------------------------------------------------------------------------------------------------------------------------------------------------------------------------- Physical Exam BP 123/75 (BP Location: Left Arm, Patient Position: Sitting, Cuff Size: Normal)   Pulse 61   Temp 97.9 F (36.6 C)   Ht 6\' 3"  (1.905 m)   Wt 187 lb 3.2 oz (84.9 kg)   SpO2 100%   BMI 23.40 kg/m  Physical Exam Constitutional:      Appearance: Normal appearance.  HENT:     Head: Normocephalic and atraumatic.  Musculoskeletal:     Cervical back: Neck supple.  Skin:    General: Skin is warm and dry.  Neurological:     Mental Status: He is alert.  Psychiatric:        Mood and Affect: Mood normal.        Behavior: Behavior normal.     ------------------------------------------------------------------------------------------------------------------------------------------------------------------------------------------------------------------- Assessment and Plan  Achilles rupture S/p surgical repair Currently doing rehab  Screening for STD (sexually  transmitted disease) Orders Placed This Encounter  Procedures  . Chlamydia/Neisseria Gonorrhoeae RNA,TMA,Urogenital  . Flu Vaccine QUAD 6+ mos PF IM (Fluarix Quad PF)  . HIV antibody (with reflex)  . RPR      No orders of the defined types were placed in this encounter.   No follow-ups on file.    This visit occurred during the SARS-CoV-2 public health emergency.  Safety protocols were in place, including screening questions prior to the visit, additional usage of staff PPE, and extensive cleaning of exam room while observing appropriate contact time as indicated for disinfecting solutions.

## 2020-08-13 NOTE — Assessment & Plan Note (Signed)
S/p surgical repair Currently doing rehab

## 2020-08-13 NOTE — Assessment & Plan Note (Signed)
Orders Placed This Encounter  Procedures  . Chlamydia/Neisseria Gonorrhoeae RNA,TMA,Urogenital  . Flu Vaccine QUAD 6+ mos PF IM (Fluarix Quad PF)  . HIV antibody (with reflex)  . RPR

## 2020-08-14 LAB — TRICHOMONAS VAGINALIS RNA, QL,MALES: Trichomonas vaginalis RNA: NOT DETECTED

## 2020-08-14 LAB — CHLAMYDIA/NEISSERIA GONORRHOEAE RNA,TMA,UROGENTIAL
C. trachomatis RNA, TMA: NOT DETECTED
N. gonorrhoeae RNA, TMA: NOT DETECTED

## 2020-08-14 LAB — HIV ANTIBODY (ROUTINE TESTING W REFLEX): HIV 1&2 Ab, 4th Generation: NONREACTIVE

## 2020-08-14 LAB — RPR: RPR Ser Ql: NONREACTIVE

## 2020-08-28 ENCOUNTER — Ambulatory Visit (INDEPENDENT_AMBULATORY_CARE_PROVIDER_SITE_OTHER): Payer: 59

## 2020-08-28 ENCOUNTER — Other Ambulatory Visit: Payer: Self-pay

## 2020-08-28 ENCOUNTER — Ambulatory Visit (INDEPENDENT_AMBULATORY_CARE_PROVIDER_SITE_OTHER): Payer: 59 | Admitting: Sports Medicine

## 2020-08-28 DIAGNOSIS — M7702 Medial epicondylitis, left elbow: Secondary | ICD-10-CM

## 2020-08-28 DIAGNOSIS — M25522 Pain in left elbow: Secondary | ICD-10-CM | POA: Diagnosis not present

## 2020-08-28 NOTE — Progress Notes (Signed)
    Procedures performed today:    None.  Independent interpretation of notes and tests performed by another provider:   None.  Brief History, Exam, Impression, and Recommendations:    Medial epicondylitis of elbow, left Fred Elliott is a pleasant 39 year old male, for many months now has had pain in his left elbow, initially suspected to be a distal biceps tendinitis, today his pain is referable more to the medial epicondyle with pain on palpation. No paresthesias down to the fingertips. Due to greater than 4 months of symptoms in spite of physician directed conservative treatment, we are going to proceed with x-rays and an MRI. I do suspect we will be discussing injection at the follow-up visit.    ___________________________________________ [name]. Benjamin Stain, M.D., ABFM., CAQSM. Primary Care and Sports Medicine [facility]  Adjunct Instructor of Family Medicine  [employer]

## 2020-08-28 NOTE — Assessment & Plan Note (Signed)
Fred Elliott is a pleasant 39 year old male, for many months now has had pain in his left elbow, initially suspected to be a distal biceps tendinitis, today his pain is referable more to the medial epicondyle with pain on palpation. No paresthesias down to the fingertips. Due to greater than 4 months of symptoms in spite of physician directed conservative treatment, we are going to proceed with x-rays and an MRI. I do suspect we will be discussing injection at the follow-up visit.

## 2020-08-29 NOTE — Telephone Encounter (Signed)
We can just transfer him to scheduling for this.

## 2020-08-30 NOTE — Telephone Encounter (Signed)
Appointment has been made. No further questions at this time.  

## 2020-09-01 ENCOUNTER — Ambulatory Visit (INDEPENDENT_AMBULATORY_CARE_PROVIDER_SITE_OTHER): Payer: 59

## 2020-09-01 ENCOUNTER — Ambulatory Visit (INDEPENDENT_AMBULATORY_CARE_PROVIDER_SITE_OTHER): Payer: 59 | Admitting: Sports Medicine

## 2020-09-01 DIAGNOSIS — M25522 Pain in left elbow: Secondary | ICD-10-CM

## 2020-09-01 DIAGNOSIS — M7702 Medial epicondylitis, left elbow: Secondary | ICD-10-CM | POA: Diagnosis not present

## 2020-09-01 NOTE — Progress Notes (Signed)
    Procedures performed today:    Procedure: Real-time Ultrasound Guided injection of the left common flexor tendon origin Device: Samsung HS60  Verbal informed consent obtained.  Time-out conducted.  Noted no overlying erythema, induration, or other signs of local infection.  Skin prepped in a sterile fashion.  Local anesthesia: Topical Ethyl chloride.  With sterile technique and under real time ultrasound guidance: 1 cc Kenalog 40, 1 cc lidocaine, 1 cc bupivacaine injected easily Completed without difficulty  Advised to call if fevers/chills, erythema, induration, drainage, or persistent bleeding.  Images permanently stored and available for review in PACS.  Impression: Technically successful ultrasound guided injection.  Independent interpretation of notes and tests performed by another provider:   None.  Brief History, Exam, Impression, and Recommendations:    Medial epicondylitis of elbow, left Confirmed with MRI, injection as above, return to see me in a month.    ___________________________________________ [name]. Benjamin Stain, M.D., ABFM., CAQSM. Primary Care and Sports Medicine [facility]  Adjunct Instructor of Family Medicine  [employer] [facility] [employer]

## 2020-09-01 NOTE — Assessment & Plan Note (Signed)
Confirmed with MRI, injection as above, return to see me in a month.

## 2020-09-22 ENCOUNTER — Telehealth (INDEPENDENT_AMBULATORY_CARE_PROVIDER_SITE_OTHER): Payer: 59 | Admitting: Nurse Practitioner

## 2020-09-22 ENCOUNTER — Encounter: Payer: Self-pay | Admitting: Family Medicine

## 2020-09-22 ENCOUNTER — Encounter: Payer: Self-pay | Admitting: Nurse Practitioner

## 2020-09-22 DIAGNOSIS — J029 Acute pharyngitis, unspecified: Secondary | ICD-10-CM | POA: Diagnosis not present

## 2020-09-22 MED ORDER — AMOXICILLIN-POT CLAVULANATE 875-125 MG PO TABS: 1.0000 | ORAL_TABLET | Freq: Two times a day (BID) | ORAL | 0 refills | Status: DC

## 2020-09-22 NOTE — Progress Notes (Signed)
Virtual Video Visit via MyChart Note  I connected with  Fred Elliott on 09/22/20 at  3:30 PM EST by the video enabled telemedicine application for , MyChart, and verified that I am speaking with the correct person using two identifiers.   I introduced myself as a Publishing rights manager with the practice. We discussed the limitations of evaluation and management by telemedicine and the availability of in person appointments. The patient expressed understanding and agreed to proceed.  Participating parties in this visit include: the patient and nurse practitioner listed only The patient is: in his car I am: in the office  Subjective:    CC:  Chief Complaint  Patient presents with  . Sore Throat    onset 6 days ago, "deep in my throat" progressively getting worse, congestion, "does not feel like a regular cold", is on an atbx for an eye problem and will be finishing it up tomorrow, COVID tested yesterday at home and it was negative    HPI: Fred Elliott is a 39 y.o. y/o male presenting via MyChart today for six days of severe sore throat and congestion.   He endorses severe sore throat with pain when turning his head at a 90 degree angle either way. He also endorses cervical lymph node enlargement and pain with swallowing.   He denies known fever, chills, cough, shortness of breath, difficulty breathing, sinus pain or pressure, ear pain or pressure, or known sick contacts. He denies abdominal pain, nausea, vomiting, or diarrhea.  He does endorse a new sexual partner, but both were tested for STI prior to engaging in any sexual contact and both parties results were negative. He denies any urinary or genitourinary symptoms that may represent STI infection in himself or partner.   He reports that he has been on doxycycline 100mg  once a day for the past 30 days (tomorrow is the last day) for an stye on his eye prescribed by the ophthalmologist, but no other recent antibiotic use.   Past medical  history, Surgical history, Family history not pertinant except as noted below, Social history, Allergies, and medications have been entered into the medical record, reviewed, and corrections made.   Review of Systems:  All review of systems negative except what is listed in the HPI  Objective:    General: Speaking clearly in complete sentences without any shortness of breath. Alert and oriented x3.   Normal judgment.  No apparent acute distress. Oropharynx is erythematous with tonsillar swelling noted bilaterally, right greater than left. No exudate noted, however, visualization limited based on video visit.   Impression and Recommendations:    1. Sore throat ^ day course of worsening sore throat with tonsillar edema and erythema. Strongly suspicious for the presence of streptococcal pharyngitis based on presentation and symptoms. Given the Elliott dosing of doxycycline, it is likely this has been ineffective for treatment.  At this time, patient unable to come to the office for testing. Will treat empirically with augmentin twice a day dosing for 5 days.  Recommend follow-up on Monday for testing if symptoms are not improving.  - amoxicillin-clavulanate (AUGMENTIN) 875-125 MG tablet; Take 1 tablet by mouth 2 (two) times daily.  Dispense: 10 tablet; Refill: 0     I discussed the assessment and treatment plan with the patient. The patient was provided an opportunity to ask questions and all were answered. The patient agreed with the plan and demonstrated an understanding of the instructions.   The patient was advised to call back or  seek an in-person evaluation if the symptoms worsen or if the condition fails to improve as anticipated.  I provided 20 minutes of non-face-to-face interaction with this MYCHART visit including intake, same-day documentation, and chart review.   Tollie Eth, NP

## 2020-09-22 NOTE — Patient Instructions (Signed)
You can use an over the counter sore throat spray and/or lozenges with the benzocaine to help with the sore throat.- Generic brands are fine. The ones that say "extra strength" usually have more benzocaine and work a little better.  Some of the brand names to look for are Cepacol or Chloraseptic.   You can also take tylenol or ibuprofen to help with the pain. Follow the directions on the packaging for dosing.   Warm drinks can also soothe the throat and honey can be helpful to coat the throat.   Make sure you are resting well and staying hydrated.  If you do not feel better by Monday, please let us know.    Strep Throat, Adult Strep throat is an infection of the throat. It is caused by germs (bacteria). Strep throat is common during the cold months of the year. It mostly affects children who are 72-63 years old. However, people of all ages can get it at any time of the year. When strep throat affects the tonsils, it is called tonsillitis. When it affects the back of the throat, it is called pharyngitis. This infection spreads from person to person through coughing, sneezing, or having close contact. What are the causes? This condition is caused by the Streptococcus pyogenes germ. What increases the risk? You are more likely to develop this condition if:  You care for young children. Children are more likely to get strep throat and may spread it to others.  You go to crowded places. Germs can spread easily in such places.  You kiss or touch someone who has strep throat. What are the signs or symptoms? Symptoms of this condition include:  Fever or chills.  Redness, swelling, or pain in the tonsils or throat.  Pain or trouble when swallowing.  White or yellow spots on the tonsils or throat.  Tender glands in the neck and under the jaw.  Bad breath.  Red rash all over the body. This is rare. How is this treated? This condition may be treated with:  Medicines that kill germs  (antibiotics).  Medicines that treat pain or fever. These include: ? Ibuprofen or acetaminophen. ? Aspirin, only for patients who are over the age of 67. ? Throat lozenges. ? Throat sprays. Follow these instructions at home: Medicines   Take over-the-counter and prescription medicines only as told by your doctor.  Take your antibiotic medicine as told by your doctor. Do not stop taking the antibiotic even if you start to feel better. Eating and drinking   If you have trouble swallowing, eat soft foods until your throat feels better.  Drink enough fluid to keep your pee (urine) pale yellow.  To help with pain, you may have: ? Warm fluids, such as soup and tea. ? Cold fluids, such as frozen desserts or popsicles. General instructions  Rinse your mouth (gargle) with a salt-water mixture 3-4 times a day or as needed. To make a salt-water mixture, dissolve -1 tsp (3-6 g) of salt in 1 cup (237 mL) of warm water.  Rest as much as you can.  Stay home from work or school until you have been taking antibiotics for 24 hours.  Avoid smoking or being around people who smoke.  Keep all follow-up visits as told by your doctor. This is important. How is this prevented?   Do not share food, drinking cups, or personal items. They can cause the germs to spread.  Wash your hands well with soap and water. Make sure  that all people in your house wash their hands well.  Have family members tested if they have a fever or a sore throat. They may need an antibiotic if they have strep throat. Contact a doctor if:  You have swelling in your neck that keeps getting bigger.  You get a rash, cough, or earache.  You cough up a thick fluid that is green, yellow-brown, or bloody.  You have pain that does not get better with medicine.  Your symptoms get worse instead of getting better.  You have a fever. Get help right away if:  You vomit.  You have a very bad headache.  Your neck hurts  or feels stiff.  You have chest pain or are short of breath.  You have drooling, very bad throat pain, or changes in your voice.  Your neck is swollen, or the skin gets red and tender.  Your mouth is dry, or you are peeing less than normal.  You keep feeling more tired or have trouble waking up.  Your joints are red or painful. Summary  Strep throat is an infection of the throat. It is caused by germs (bacteria).  This infection can spread from person to person through coughing, sneezing, or having close contact.  Take your medicines, including antibiotics, as told by your doctor. Do not stop taking the antibiotic even if you start to feel better.  To prevent the spread of germs, wash your hands well with soap and water. Have others do the same. Do not share food, drinking cups, or personal items.  Get help right away if you have a bad headache, chest pain, shortness of breath, a stiff or painful neck, or you vomit. This information is not intended to replace advice given to you by your health care provider. Make sure you discuss any questions you have with your health care provider. Document Revised: 12/25/2018 Document Reviewed: 12/25/2018 Elsevier Patient Education  2020 [employer].

## 2020-09-29 ENCOUNTER — Ambulatory Visit: Payer: 59 | Admitting: Sports Medicine

## 2020-10-05 IMAGING — DX DG CHEST 2V
2 series · 2 of 2 positions shown · non-contrast
Comparison: Chest radiograph 11/20/2016

CLINICAL DATA: Patient with fatigue, headache and productive cough

EXAM:
CHEST - 2 VIEW

[chest pa]
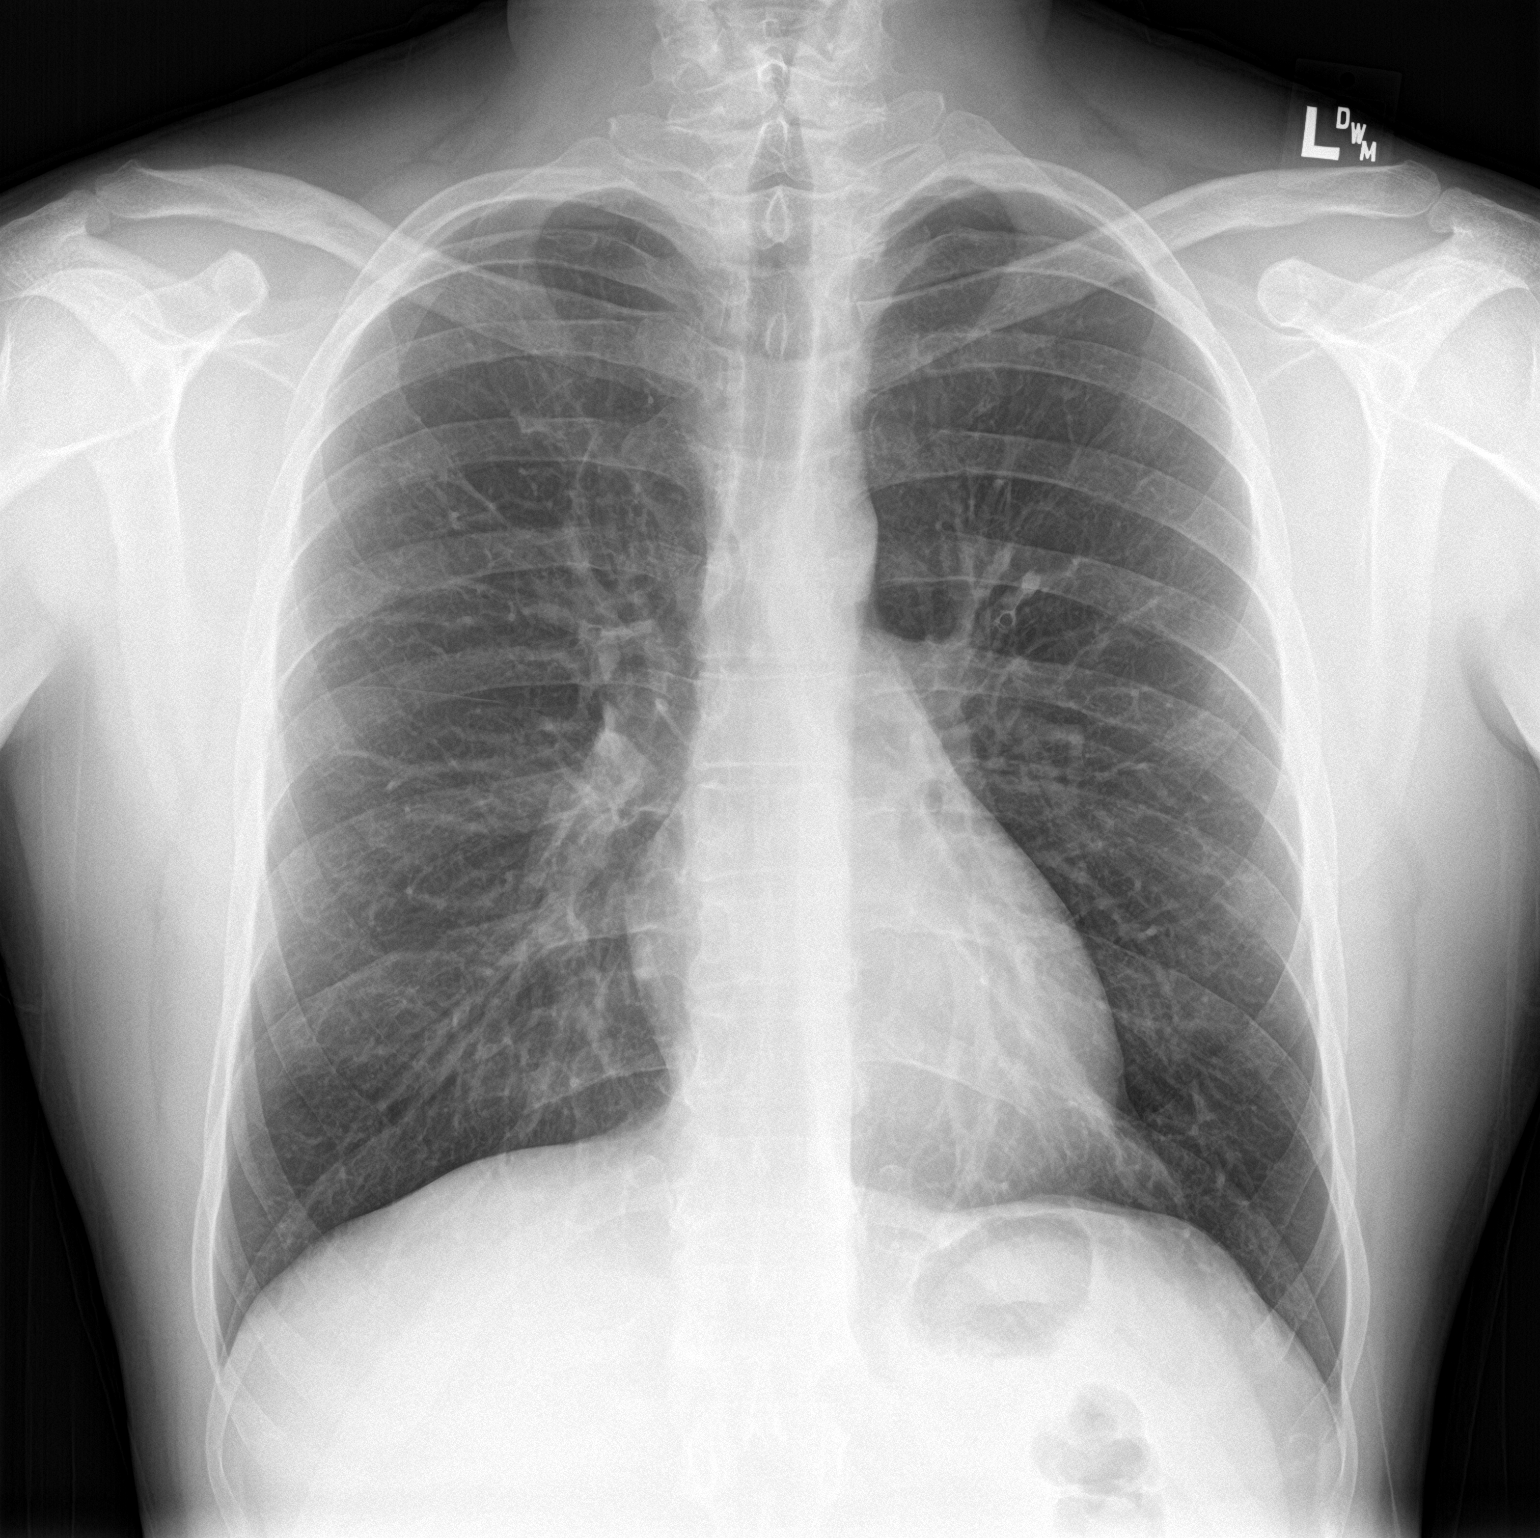

[chest lat]
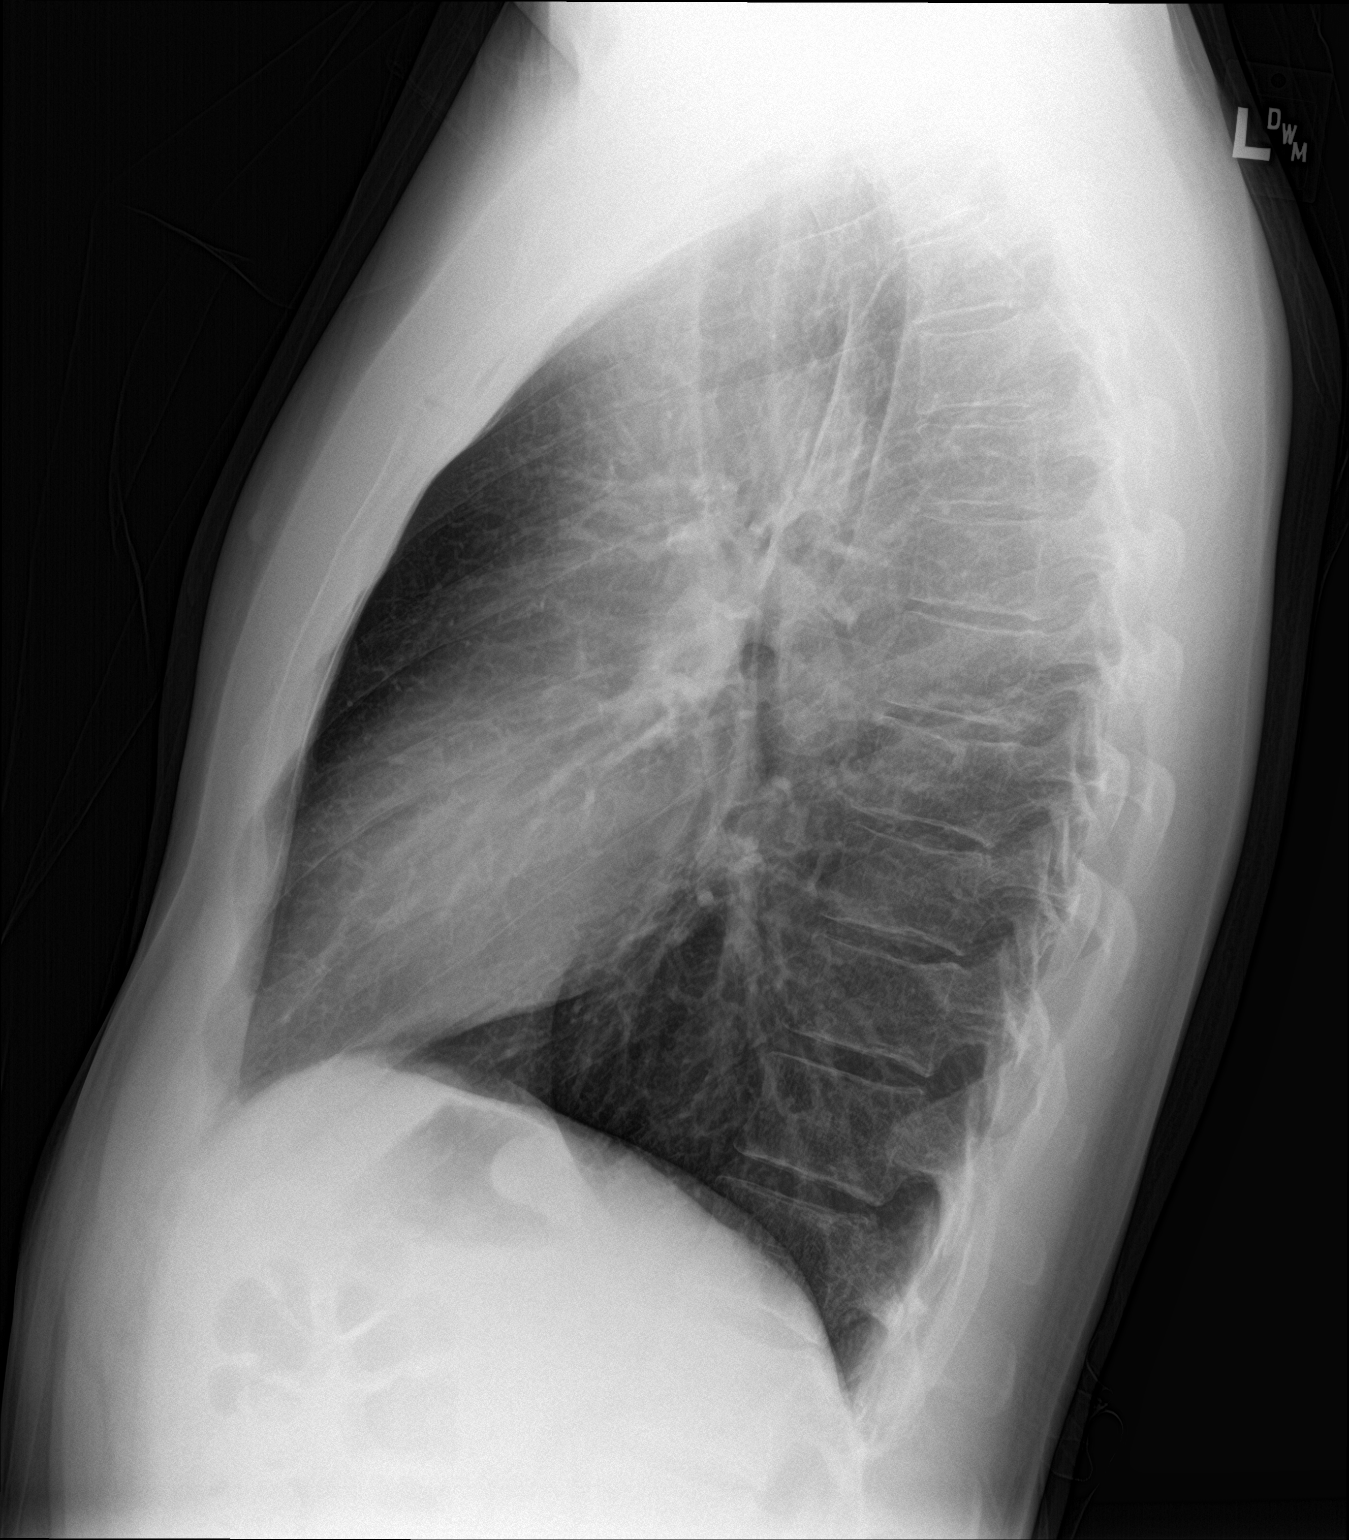

[2 of 2 positions shown; findings below may reference images not displayed]

FINDINGS: Normal cardiac and mediastinal contours. No consolidative pulmonary
opacities. No pleural effusion or pneumothorax.
IMPRESSION: No active cardiopulmonary disease.

## 2020-10-18 ENCOUNTER — Encounter: Payer: Self-pay | Admitting: Family Medicine

## 2021-01-24 ENCOUNTER — Ambulatory Visit (INDEPENDENT_AMBULATORY_CARE_PROVIDER_SITE_OTHER): Payer: 59 | Admitting: Sports Medicine

## 2021-01-24 ENCOUNTER — Other Ambulatory Visit: Payer: Self-pay

## 2021-01-24 DIAGNOSIS — S46311A Strain of muscle, fascia and tendon of triceps, right arm, initial encounter: Secondary | ICD-10-CM

## 2021-01-24 NOTE — Progress Notes (Signed)
    Procedures performed today:    None.  Independent interpretation of notes and tests performed by another provider:   None.  Brief History, Exam, Impression, and Recommendations:    Strain of right triceps This is a pleasant 40 year old male, a week ago he was doing tricep extensions with overhead dumbbell work. He felt a pain at the medial tricep near the insertion, he is here for further evaluation. No evidence of rupture, no swelling, or bruising, he does have some tenderness at the triceps musculotendinous junction. No history of fluoroquinolone use. He has had several tendinopathic type injuries including a right Achilles rupture postoperative repair, as well as a left common flexor tendon partial tear that responded well to an injection. I have advised him that from now on his conditioning and his exercise needs to be in an eccentric rather than in a concentric fashion. He will also lay off any tricep work including chest press, push-ups, and dips for at least 1 to 2 weeks.     ___________________________________________ [name]. Benjamin Stain, M.D., ABFM., CAQSM. Primary Care and Sports Medicine [facility]  Adjunct Instructor of Family Medicine  [employer]

## 2021-01-24 NOTE — Assessment & Plan Note (Addendum)
This is a pleasant 40 year old male, a week ago he was doing tricep extensions with overhead dumbbell work. He felt a pain at the medial tricep near the insertion, he is here for further evaluation. No evidence of rupture, no swelling, or bruising, he does have some tenderness at the triceps musculotendinous junction. No history of fluoroquinolone use. He has had several tendinopathic type injuries including a right Achilles rupture postoperative repair, as well as a left common flexor tendon partial tear that responded well to an injection. I have advised him that from now on his conditioning and his exercise needs to be in an eccentric rather than in a concentric fashion. He will also lay off any tricep work including chest press, push-ups, and dips for at least 1 to 2 weeks.

## 2021-04-26 ENCOUNTER — Ambulatory Visit (INDEPENDENT_AMBULATORY_CARE_PROVIDER_SITE_OTHER): Payer: 59 | Admitting: Family Medicine

## 2021-04-26 ENCOUNTER — Other Ambulatory Visit: Payer: Self-pay

## 2021-04-26 ENCOUNTER — Encounter: Payer: Self-pay | Admitting: Family Medicine

## 2021-04-26 VITALS — BP 118/75 | HR 71 | Temp 97.7°F | Resp 17

## 2021-04-26 DIAGNOSIS — M778 Other enthesopathies, not elsewhere classified: Secondary | ICD-10-CM

## 2021-04-26 MED ORDER — MELOXICAM 15 MG PO TABS: 15.0000 mg | ORAL_TABLET | Freq: Every day | ORAL | 0 refills | Status: AC

## 2021-04-26 NOTE — Progress Notes (Signed)
Acute Office Visit  Subjective:    Patient ID: Fred Elliott, male    DOB: Apr 28, 1981, 40 y.o.   MRN: [id number]  Chief Complaint  Patient presents with   Elbow Pain    HPI Patient is in today for elbow pain.  Patient states he has been having some pain above the tip of his right elbow for the past week or so.  He has been gradually returning to workout after having a right medial tricep strain in April.  He does not recall any accidents or injuries.  States it is just very sore 5-6 out of 10 on the pain scale with the worst pain being on Monday and slow gradual improvement since then.  He did skip his workout yesterday and that seemed to help somewhat.  Reports he has full range of motion and was able to use his normal weights for workouts, however he just had pain primarily with tricep extension.  Pain is also worse with leaning against pressing on it or pushing up with the right arm.  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 No other symptoms consistent with bursitis. He has not yet tried any over-the-counter meds.  He denies any popping or clicking.  He does have a history of several other tendon issues.  He tore his right Achilles last August and was in rehab through March.  About that time he started working out again gradually and within a week or 2 he strained his right medial tricep and saw Dr. Karie Schwalbe in our office.  He gradually returned to exercise and has been doing well until about a week ago when the right distal tricep began bothering him as described above.     Past Medical History:  Diagnosis Date   Seizures (HCC)    4th grade, last seizure around age 21    Past Surgical History:  Procedure Laterality Date   NASAL SINUS SURGERY      Family History  Problem Relation Age of Onset   Crohn's disease Maternal Grandmother    Pancreatic cancer Maternal Grandmother    Colon cancer Maternal Grandfather     Alcohol abuse Paternal Grandfather    Lung cancer Paternal Grandfather    Colon cancer Paternal Grandfather     Social History   Socioeconomic History   Marital status: Single    Spouse name: Not on file   Number of children: Not on file   Years of education: Not on file   Highest education level: Not on file  Occupational History   Not on file  Tobacco Use   Smoking status: Never   Smokeless tobacco: Never  Substance and Sexual Activity   Alcohol use: No   Drug use: No   Sexual activity: Yes    Birth control/protection: None  Other Topics Concern   Not on file  Social History Narrative   Not on file   Social Determinants of Health   Financial Resource Strain: Not on file  Food Insecurity: Not on file  Transportation Needs: Not on file  Physical Activity: Not on file  Stress: Not on file  Social Connections: Not on file  Intimate Partner Violence: Not on file    Outpatient Medications Prior to Visit  Medication Sig Dispense Refill   amoxicillin-clavulanate (AUGMENTIN) 875-125 MG tablet Take 1 tablet by mouth 2 (two) times daily. 10 tablet 0   doxycycline (VIBRAMYCIN) 100  MG capsule Take 100 mg by mouth daily.     No facility-administered medications prior to visit.    Allergies  Allergen Reactions   Bee Venom     Review of Systems All review of systems negative except what is listed in the HPI     Objective:    Physical Exam Vitals reviewed.  Constitutional:      Appearance: Normal appearance.  Musculoskeletal:        General: Tenderness present. No swelling or signs of injury. Normal range of motion.     Right elbow: No swelling, deformity, effusion or lacerations. Normal range of motion. Tenderness present.     Left elbow: Normal.       Arms:  Skin:    General: Skin is warm and dry.     Findings: No rash.  Neurological:     General: No focal deficit present.     Mental Status: He is alert and oriented to person, place, and time. Mental status  is at baseline.  Psychiatric:        Mood and Affect: Mood normal.        Behavior: Behavior normal.        Thought Content: Thought content normal.        Judgment: Judgment normal.    BP 118/75   Pulse 71   Temp 97.7 F (36.5 C)   Resp 17   SpO2 97%  Wt Readings from Last 3 Encounters:  08/11/20 187 lb 3.2 oz (84.9 kg)  02/18/20 186 lb 1.6 oz (84.4 kg)  12/21/19 192 lb (87.1 kg)    Health Maintenance Due  Topic Date Due   COVID-19 Vaccine (3 - Booster for Pfizer series) 07/13/2020    There are no preventive care reminders to display for this patient.   Lab Results  Component Value Date   TSH 0.69 02/18/2020   Lab Results  Component Value Date   WBC 5.7 02/18/2020   HGB 13.9 02/18/2020   HCT 40.9 02/18/2020   MCV 86.5 02/18/2020   PLT 252 02/18/2020   Lab Results  Component Value Date   NA 140 02/18/2020   K 4.4 02/18/2020   CO2 27 02/18/2020   GLUCOSE 93 02/18/2020   BUN 19 02/18/2020   CREATININE 1.03 02/18/2020   BILITOT 0.7 02/18/2020   ALKPHOS 68 12/09/2016   AST 13 02/18/2020   ALT 11 02/18/2020   PROT 7.1 02/18/2020   ALBUMIN 4.2 12/09/2016   CALCIUM 9.8 02/18/2020   Lab Results  Component Value Date   CHOL 152 02/18/2020   Lab Results  Component Value Date   HDL 49 02/18/2020   Lab Results  Component Value Date   LDLCALC 84 02/18/2020   Lab Results  Component Value Date   TRIG 94 02/18/2020   Lab Results  Component Value Date   CHOLHDL 3.1 02/18/2020   Lab Results  Component Value Date   HGBA1C 5.0 12/09/2016       Assessment & Plan:   1. Tendinitis of right triceps Presentation consistent with tendinitis of distal right triceps; HPI and exam not consistent with olecranon bursitis. Recommend rest, ice, elevation, meloxicam. After 1-2 weeks of resting the tendon, can gradually add back in some triceps stretching/rehab (handout provided). If not noticing some improvement over the next 3 weeks or if symptoms worsen before  then, see Dr. Karie Schwalbe, sports medicine. History of multiple tendon issues, recommend not overdoing it with working out and very gradual strengthening exercises once  symptoms improve. Avoid fluoroquinolone in general d/t risk for tendon rupture. Patient aware of signs/symptoms requiring further/urgent evaluation.   - meloxicam (MOBIC) 15 MG tablet; Take 1 tablet (15 mg total) by mouth daily.  Dispense: 30 tablet; Refill: 0  Follow-up in 3 weeks with Dr. Karie Schwalbe if not improving, or sooner if symptoms worsen.   Lollie Marrow Reola Calkins, DNP, FNP-C

## 2021-05-03 ENCOUNTER — Other Ambulatory Visit: Payer: Self-pay

## 2021-05-03 ENCOUNTER — Encounter: Payer: Self-pay | Admitting: Family Medicine

## 2021-05-03 ENCOUNTER — Ambulatory Visit (INDEPENDENT_AMBULATORY_CARE_PROVIDER_SITE_OTHER): Payer: 59 | Admitting: Family Medicine

## 2021-05-03 VITALS — BP 128/77 | HR 64 | Temp 98.0°F | Resp 16 | Wt 185.2 lb

## 2021-05-03 DIAGNOSIS — Z0000 Encounter for general adult medical examination without abnormal findings: Secondary | ICD-10-CM | POA: Diagnosis not present

## 2021-05-03 DIAGNOSIS — Z113 Encounter for screening for infections with a predominantly sexual mode of transmission: Secondary | ICD-10-CM | POA: Diagnosis not present

## 2021-05-03 NOTE — Progress Notes (Signed)
BP 128/77   Pulse 64   Temp 98 F (36.7 C)   Resp 16   Wt 185 lb 3.2 oz (84 kg)   SpO2 98%   BMI 23.15 kg/m    Subjective:    Patient ID: Fred Elliott, male    DOB: 1981-09-15, 40 y.o.   MRN: [id number]  HPI: Fred Elliott is a 40 y.o. male presenting on 05/03/2021 for comprehensive medical examination. Current medical complaints include:none  He currently lives with: alone Interim Problems from his last visit: no  He reports regular vision exams q1-5y: yes He reports regular dental exams q 68m: yes His diet consists of: carbs, meats, dairy, fruits and veggies He endorses exercise and/or activity of: gym workouts regularly He works at: [employer]   He endorses ETOH use - rarely, less than once per week He denies nictoine use He denies illegal substance use   He is not currently sexually active - recently with male. He denies concerns today about STI, but would like routine screening  He denies concerns about skin changes today:  He denies concerns about bowel changes today:  He denies concerns about bladder changes today:   Depression Screen done today and results listed below:  Depression screen [facility] 2/9 05/03/2021 02/18/2020 12/09/2016  Decreased Interest 0 0 0  Down, Depressed, Hopeless 0 1 0  PHQ - 2 Score 0 1 0  Altered sleeping - 0 -  Tired, decreased energy - 1 -  Change in appetite - 0 -  Feeling bad or failure about yourself  - 0 -  Trouble concentrating - 0 -  Moving slowly or fidgety/restless - 0 -  Suicidal thoughts - 0 -  PHQ-9 Score - 2 -  Difficult doing work/chores - Not difficult at all -    The patient does not have a history of falls.     Past Medical History:  Past Medical History:  Diagnosis Date   Seizures (HCC)    4th grade, last seizure around age 40    Surgical History:  Past Surgical History:  Procedure Laterality Date   NASAL SINUS SURGERY      Medications:  Current Outpatient Medications on File Prior to Visit  Medication Sig    meloxicam (MOBIC) 15 MG tablet Take 1 tablet (15 mg total) by mouth daily.   No current facility-administered medications on file prior to visit.    Allergies:  Allergies  Allergen Reactions   Bee Venom     Social History:  Social History   Socioeconomic History   Marital status: Single    Spouse name: Not on file   Number of children: Not on file   Years of education: Not on file   Highest education level: Not on file  Occupational History   Not on file  Tobacco Use   Smoking status: Never   Smokeless tobacco: Never  Substance and Sexual Activity   Alcohol use: No   Drug use: No   Sexual activity: Yes    Birth control/protection: None  Other Topics Concern   Not on file  Social History Narrative   Not on file   Social Determinants of Health   Financial Resource Strain: Not on file  Food Insecurity: Not on file  Transportation Needs: Not on file  Physical Activity: Not on file  Stress: Not on file  Social Connections: Not on file  Intimate Partner Violence: Not on file   Social History   Tobacco Use  Smoking Status Never  Smokeless Tobacco Never   Social History   Substance and Sexual Activity  Alcohol Use No    Family History:  Family History  Problem Relation Age of Onset   Crohn's disease Maternal Grandmother    Pancreatic cancer Maternal Grandmother    Colon cancer Maternal Grandfather    Alcohol abuse Paternal Grandfather    Lung cancer Paternal Grandfather    Colon cancer Paternal Grandfather     Past medical history, surgical history, medications, allergies, family history and social history reviewed with patient today and changes made to appropriate areas of the chart.   All ROS negative except what is listed above and in the HPI.      Objective:    BP 128/77   Pulse 64   Temp 98 F (36.7 C)   Resp 16   Wt 185 lb 3.2 oz (84 kg)   SpO2 98%   BMI 23.15 kg/m   Wt Readings from Last 3 Encounters:  05/03/21 185 lb 3.2 oz (84  kg)  08/11/20 187 lb 3.2 oz (84.9 kg)  02/18/20 186 lb 1.6 oz (84.4 kg)    Physical Exam Vitals reviewed.  Constitutional:      Appearance: Normal appearance. He is normal weight.  HENT:     Head: Normocephalic and atraumatic.     Right Ear: Tympanic membrane normal.     Left Ear: Tympanic membrane normal.     Nose: Nose normal.     Mouth/Throat:     Mouth: Mucous membranes are moist.     Pharynx: Oropharynx is clear. No oropharyngeal exudate or posterior oropharyngeal erythema.  Eyes:     Extraocular Movements: Extraocular movements intact.     Conjunctiva/sclera: Conjunctivae normal.     Pupils: Pupils are equal, round, and reactive to light.  Cardiovascular:     Rate and Rhythm: Normal rate and regular rhythm.     Pulses: Normal pulses.     Heart sounds: Normal heart sounds.  Pulmonary:     Effort: Pulmonary effort is normal.     Breath sounds: Normal breath sounds.  Abdominal:     General: Abdomen is flat. Bowel sounds are normal. There is no distension.     Palpations: Abdomen is soft.     Tenderness: There is no abdominal tenderness. There is no guarding.  Genitourinary:    Comments: Exam deferred Musculoskeletal:        General: Normal range of motion.     Cervical back: Normal range of motion and neck supple. No tenderness.  Lymphadenopathy:     Cervical: No cervical adenopathy.  Skin:    General: Skin is warm and dry.     Comments: Few scattered macules/papules, no concerning lesions today  Neurological:     General: No focal deficit present.     Mental Status: He is alert and oriented to person, place, and time. Mental status is at baseline.  Psychiatric:        Mood and Affect: Mood normal.        Behavior: Behavior normal.        Thought Content: Thought content normal.        Judgment: Judgment normal.    Results for orders placed or performed in visit on 08/11/20  Chlamydia/Neisseria Gonorrhoeae RNA,TMA,Urogenital  Result Value Ref Range   C.  trachomatis RNA, TMA Not Detected Not Detect   N. gonorrhoeae RNA, TMA Not Detected Not Detect  HIV antibody (with reflex)  Result Value Ref Range  HIV 1&2 Ab, 4th Generation NON-REACTIVE NON-REACTI  RPR  Result Value Ref Range   RPR Ser Ql NON-REACTIVE NON-REACTI  Trichomonas vaginalis RNA, Ql,Males  Result Value Ref Range   Trichomonas vaginalis RNA NOT DETECTED NOT DETECT      Assessment & Plan:   Problem List Items Addressed This Visit   None Visit Diagnoses     Wellness examination    -  Primary   Relevant Orders   Lipid panel   CBC   COMPLETE METABOLIC PANEL WITH GFR   C. trachomatis/N. gonorrhoeae RNA   RPR   HIV antibody (with reflex)   Routine screening for STI (sexually transmitted infection)       Relevant Orders   C. trachomatis/N. gonorrhoeae RNA   RPR   HIV antibody (with reflex)          LABORATORY TESTING:  Health maintenance labs ordered today as discussed above.  - STI testing: routine screening for GC/Chlamydia, RPR, HIV per patient request, no symptoms - routine labs ordered: CBC, CMP, lipids   IMMUNIZATIONS:   - Tdap: Tetanus vaccination status reviewed: last tetanus booster within 10 years. - Influenza: Postponed to flu season - Pneumovax: Not applicable - Prevnar: Not applicable - HPV: Not applicable - Shingrix vaccine: Not applicable - COVID-19: Up to date  SCREENING: - Colonoscopy: Not applicable  Discussed with patient purpose of the colonoscopy is to detect colon cancer at curable precancerous or early stages  - AAA Screening: Not applicable  -Hearing Test: Not applicable  -Spirometry: Not applicable  - Lung Cancer Screening: Not applicable    PATIENT COUNSELING:    Sexuality: Discussed sexually transmitted diseases, partner selection, use of condoms, avoidance of unintended pregnancy, and contraceptive alternatives.   I discussed with the patient that most people either abstain from alcohol or drink within safe limits  (<=14/week and <=4 drinks/occasion for males, <=7/weeks and <= 3 drinks/occasion for females) and that the risk for alcohol disorders and other health effects rises proportionally with the number of drinks per week and how often a drinker exceeds daily limits.  Discussed cessation/primary prevention of drug use and availability of treatment for abuse.   Diet: Encouraged to adjust caloric intake to maintain or achieve ideal body weight, to reduce intake of dietary saturated fat and total fat, to limit sodium intake by avoiding high sodium foods and not adding table salt, and to maintain adequate dietary potassium and calcium preferably from fresh fruits, vegetables, and low-fat dairy products.    Emphasized the importance of regular exercise  Injury prevention: Discussed safety belts, safety helmets, smoke detector, smoking near bedding or upholstery.   Dental health: Discussed importance of regular tooth brushing, flossing, and dental visits.   Follow up plan:  Return if symptoms worsen or fail to improve.

## 2021-05-04 LAB — COMPLETE METABOLIC PANEL WITH GFR
AG Ratio: 1.6 (calc) (ref 1.0–2.5)
ALT: 16 U/L (ref 9–46)
AST: 16 U/L (ref 10–40)
Albumin: 4.5 g/dL (ref 3.6–5.1)
Alkaline phosphatase (APISO): 61 U/L (ref 36–130)
BUN: 18 mg/dL (ref 7–25)
CO2: 31 mmol/L (ref 20–32)
Calcium: 9.6 mg/dL (ref 8.6–10.3)
Chloride: 101 mmol/L (ref 98–110)
Creat: 1.06 mg/dL (ref 0.60–1.26)
Globulin: 2.9 g/dL (calc) (ref 1.9–3.7)
Glucose, Bld: 87 mg/dL (ref 65–99)
Potassium: 4.6 mmol/L (ref 3.5–5.3)
Sodium: 138 mmol/L (ref 135–146)
Total Bilirubin: 0.8 mg/dL (ref 0.2–1.2)
Total Protein: 7.4 g/dL (ref 6.1–8.1)
eGFR: 92 mL/min/{1.73_m2} (ref >=60)

## 2021-05-04 LAB — CBC
HCT: 43.1 % (ref 38.5–50.0)
Hemoglobin: 14.7 g/dL (ref 13.2–17.1)
MCH: 29.7 pg (ref 27.0–33.0)
MCHC: 34.1 g/dL (ref 32.0–36.0)
MCV: 87.1 fL (ref 80.0–100.0)
MPV: 10.7 fL (ref 7.5–12.5)
Platelets: 262 10*3/uL (ref 140–400)
RBC: 4.95 10*6/uL (ref 4.20–5.80)
RDW: 11.9 % (ref 11.0–15.0)
WBC: 5.9 10*3/uL (ref 3.8–10.8)

## 2021-05-04 LAB — LIPID PANEL
Cholesterol: 166 mg/dL (ref <200)
HDL: 49 mg/dL (ref >=40)
LDL Cholesterol (Calc): 97 mg/dL (calc)
Non-HDL Cholesterol (Calc): 117 mg/dL (calc) (ref <130)
Total CHOL/HDL Ratio: 3.4 (calc) (ref <5.0)
Triglycerides: 104 mg/dL (ref <150)

## 2021-05-04 LAB — C. TRACHOMATIS/N. GONORRHOEAE RNA
C. trachomatis RNA, TMA: NOT DETECTED
N. gonorrhoeae RNA, TMA: NOT DETECTED

## 2021-05-04 LAB — RPR: RPR Ser Ql: NONREACTIVE

## 2021-05-04 LAB — HIV ANTIBODY (ROUTINE TESTING W REFLEX): HIV 1&2 Ab, 4th Generation: NONREACTIVE

## 2021-05-04 NOTE — Progress Notes (Signed)
MyChart message sent: All of your labs were normal!
